# Patient Record
Sex: Male | Born: 1970 | Race: White | Hispanic: No | Marital: Married | Smoking: Never smoker
Health system: Southern US, Community
[De-identification: ages and names within clinical notes are randomized; demographics above are authoritative.]

## PROBLEM LIST (undated history)

## (undated) DIAGNOSIS — E785 Hyperlipidemia, unspecified: Secondary | ICD-10-CM

## (undated) DIAGNOSIS — K760 Fatty (change of) liver, not elsewhere classified: Secondary | ICD-10-CM

## (undated) DIAGNOSIS — C4491 Basal cell carcinoma of skin, unspecified: Secondary | ICD-10-CM

## (undated) DIAGNOSIS — J939 Pneumothorax, unspecified: Secondary | ICD-10-CM

## (undated) DIAGNOSIS — E119 Type 2 diabetes mellitus without complications: Secondary | ICD-10-CM

## (undated) HISTORY — DX: Pneumothorax, unspecified: J93.9

## (undated) HISTORY — DX: Fatty (change of) liver, not elsewhere classified: K76.0

## (undated) HISTORY — DX: Hereditary hemochromatosis: E83.110

## (undated) HISTORY — DX: Basal cell carcinoma of skin, unspecified: C44.91

## (undated) HISTORY — DX: Hyperlipidemia, unspecified: E78.5

---

## 2005-03-17 ENCOUNTER — Emergency Department: Payer: Self-pay | Admitting: Emergency Medicine

## 2011-11-18 ENCOUNTER — Ambulatory Visit (INDEPENDENT_AMBULATORY_CARE_PROVIDER_SITE_OTHER): Payer: PRIVATE HEALTH INSURANCE | Admitting: Family Medicine

## 2011-11-18 ENCOUNTER — Encounter: Payer: Self-pay | Admitting: Family Medicine

## 2011-11-18 VITALS — BP 120/76 | HR 88 | Temp 98.2°F | Ht 73.0 in | Wt 267.0 lb

## 2011-11-18 DIAGNOSIS — Z0000 Encounter for general adult medical examination without abnormal findings: Secondary | ICD-10-CM

## 2011-11-18 DIAGNOSIS — Z131 Encounter for screening for diabetes mellitus: Secondary | ICD-10-CM

## 2011-11-18 DIAGNOSIS — R5381 Other malaise: Secondary | ICD-10-CM

## 2011-11-18 DIAGNOSIS — Z13220 Encounter for screening for lipoid disorders: Secondary | ICD-10-CM

## 2011-11-18 NOTE — Patient Instructions (Signed)
F/u and schedule lab draw on your way out.

## 2011-11-18 NOTE — Progress Notes (Signed)
Nature conservation officer at [facility] [address] [place] [address] Phone: [phone] Fax: [phone]  Date:  11/18/2011   Name:  Ryan Calderon   DOB:  August 16, 1970   MRN:  [id number]  PCP:  Hannah Beat, MD    Chief Complaint: Establish Care   History of Present Illness:  Ryan Calderon is a 41 y.o. very pleasant male patient who presents with the following:  Has gained some weight. Works outside. 240 - then up to 267 now. 8-9 hours of sleep a night. Has changed what he is doing. Not really out in the field and all in the office.  Last 2-3 months. Gets check-ups at [facility].   6 months to a year.   Preventative Health Maintenance Visit:  Health Maintenance Summary Reviewed and updated, unless pt declines services.  Tobacco History Reviewed. Will Dip Alcohol: No concerns, no excessive use Exercise Habits: Some activity, rec at least 30 mins 5 times a week - rare now STD concerns: no risk or activity to increase risk Drug Use: None Encouraged self-testicular check  The PMH, PSH, Social History, Family History, Medications, and allergies have been reviewed in [facility], and have been updated if relevant.   History  Substance Use Topics  . Smoking status: Never Smoker   . Smokeless tobacco: Not on file  . Alcohol Use: Yes    No family history on file.  No Known Allergies  Medication list has been reviewed and updated.  No current outpatient prescriptions on file prior to visit.    Review of Systems:   General: Denies fever, chills, sweats. No significant weight loss. FATIGUE Eyes: Denies blurring,significant itching ENT: Denies earache, sore throat, and hoarseness. Cardiovascular: Denies chest pains, palpitations, dyspnea on exertion Respiratory: Denies cough, dyspnea at rest,wheeezing Breast: no concerns about lumps GI: Denies nausea, vomiting, diarrhea, constipation, change in bowel habits, abdominal pain, melena, hematochezia GU: Denies penile  discharge, ED, urinary flow / outflow problems. No STD concerns. DECREASED LIBIDO Musculoskeletal: Denies back pain, joint pain Derm: Denies rash, itching Neuro: Denies  paresthesias, frequent falls, frequent headaches Psych: Denies depression, anxiety Endocrine: Denies cold intolerance, heat intolerance, polydipsia Heme: Denies enlarged lymph nodes Allergy: No hayfever   Physical Examination: Filed Vitals:   11/18/11 1348  BP: 120/76  Pulse: 88  Temp: 98.2 F (36.8 C)   Filed Vitals:   11/18/11 1348  Height: 6\' 1"  (1.854 m)  Weight: 267 lb (121.11 kg)   Body mass index is 35.23 kg/(m^2). Ideal Body Weight: Weight in (lb) to have BMI = 25: 189.1   GEN: well developed, well nourished, no acute distress Eyes: conjunctiva and lids normal, PERRLA, EOMI ENT: TM clear, nares clear, oral exam WNL Neck: supple, no lymphadenopathy, no thyromegaly, no JVD Pulm: clear to auscultation and percussion, respiratory effort normal CV: regular rate and rhythm, S1-S2, no murmur, rub or gallop, no bruits, peripheral pulses normal and symmetric, no cyanosis, clubbing, edema or varicosities GI: soft, non-tender; no hepatosplenomegaly, masses; active bowel sounds all quadrants Lymph: no cervical, axillary or inguinal adenopathy MSK: gait normal, muscle tone and strength WNL, no joint swelling, effusions, discoloration, crepitus  SKIN: clear, good turgor, color WNL, no rashes, lesions, or ulcerations Neuro: normal mental status, normal strength, sensation, and motion Psych: alert; oriented to person, place and time, normally interactive and not anxious or depressed in appearance.   Assessment and Plan: 1. Routine general medical examination at a health care facility    2. Screening for lipoid disorders  3. Screening for diabetes mellitus    4. Other malaise and fatigue  Basic metabolic panel, CBC with Differential, Hepatic function panel, TSH, Testosterone, free, total    Generally doing  well. We will obtain his labs that he is gotten previously from his wife's work.  He has a lot of concerns about his decreased libido, fatigue, and generally not feeling well rested. He is also gained about 20-30 pounds recently. We will check labs as above and be sure to do this in the morning to obtain an a.m. testosterone.  Hannah Beat, MD

## 2011-11-22 ENCOUNTER — Other Ambulatory Visit: Payer: PRIVATE HEALTH INSURANCE

## 2013-01-21 ENCOUNTER — Ambulatory Visit: Payer: Self-pay

## 2014-10-30 IMAGING — CR DG FOOT COMPLETE 3+V*L*
1 series · 3 of 3 positions shown · non-contrast
Comparison: none

REASON FOR EXAM: crush injury
COMMENTS:

[Series 1: ap · 0.17mm/px · 3 of 3 slices shown]
[im 1/3]
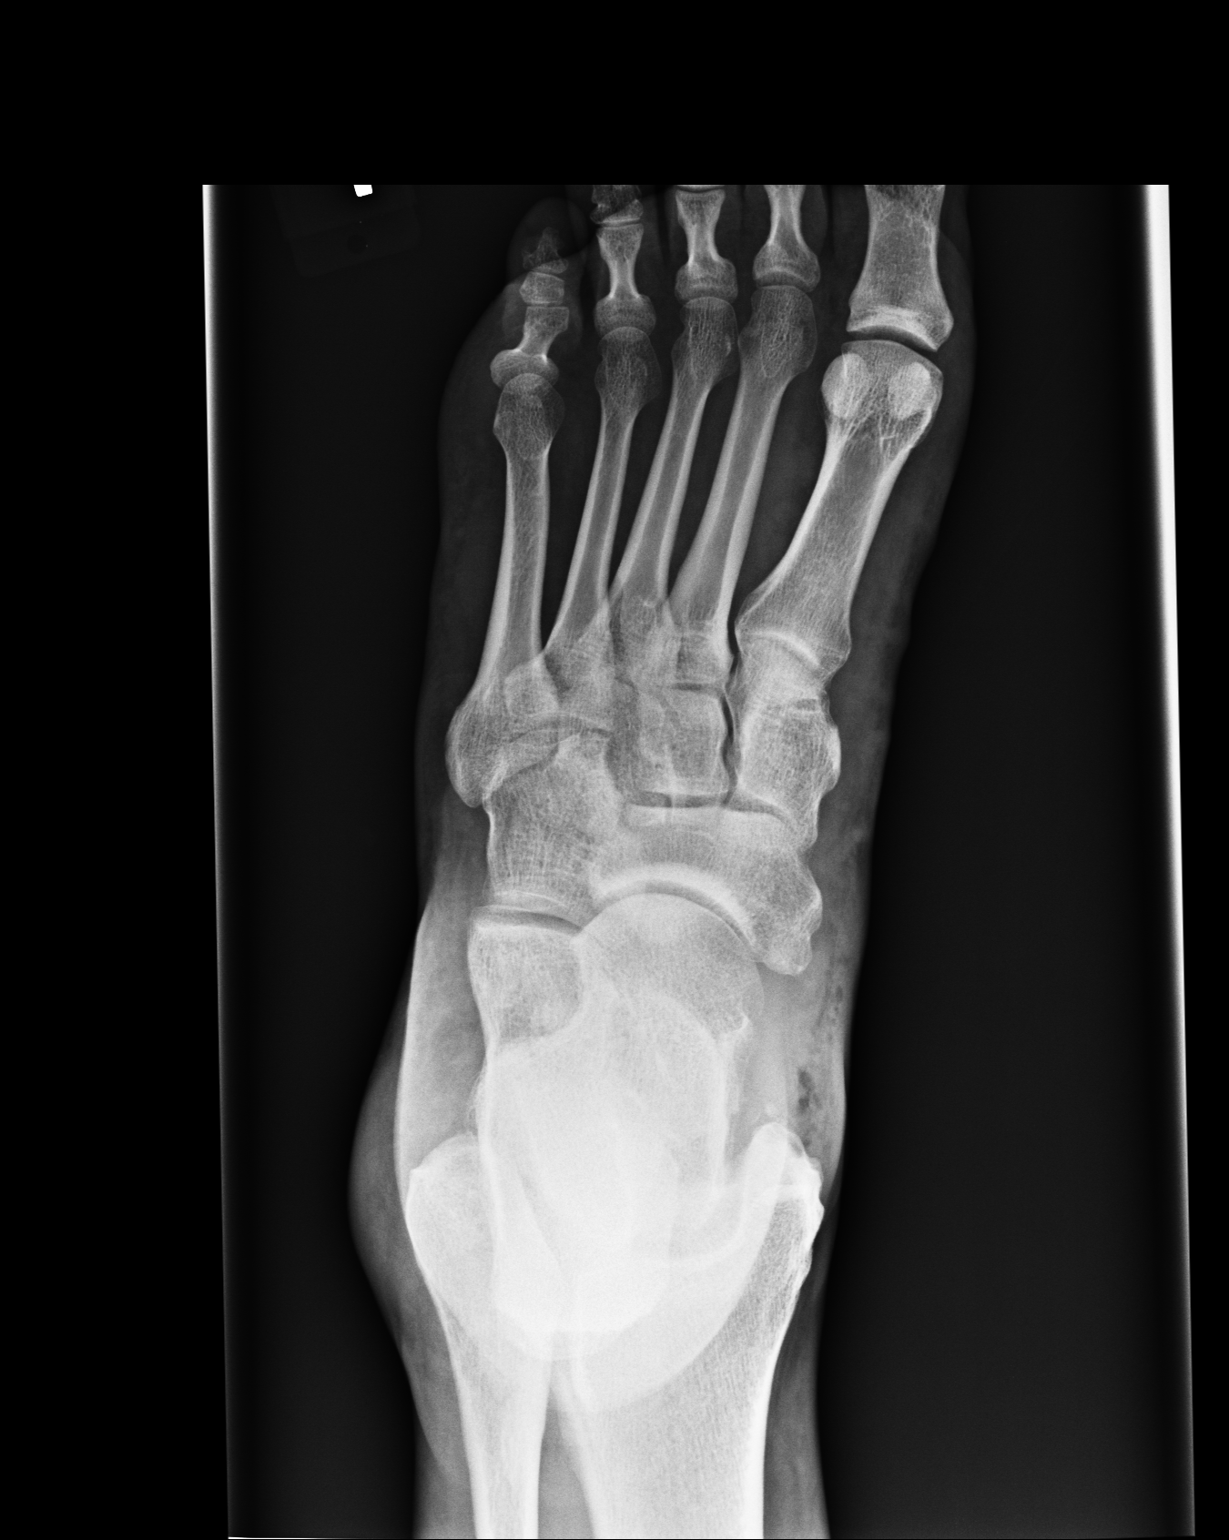
[im 2/3]
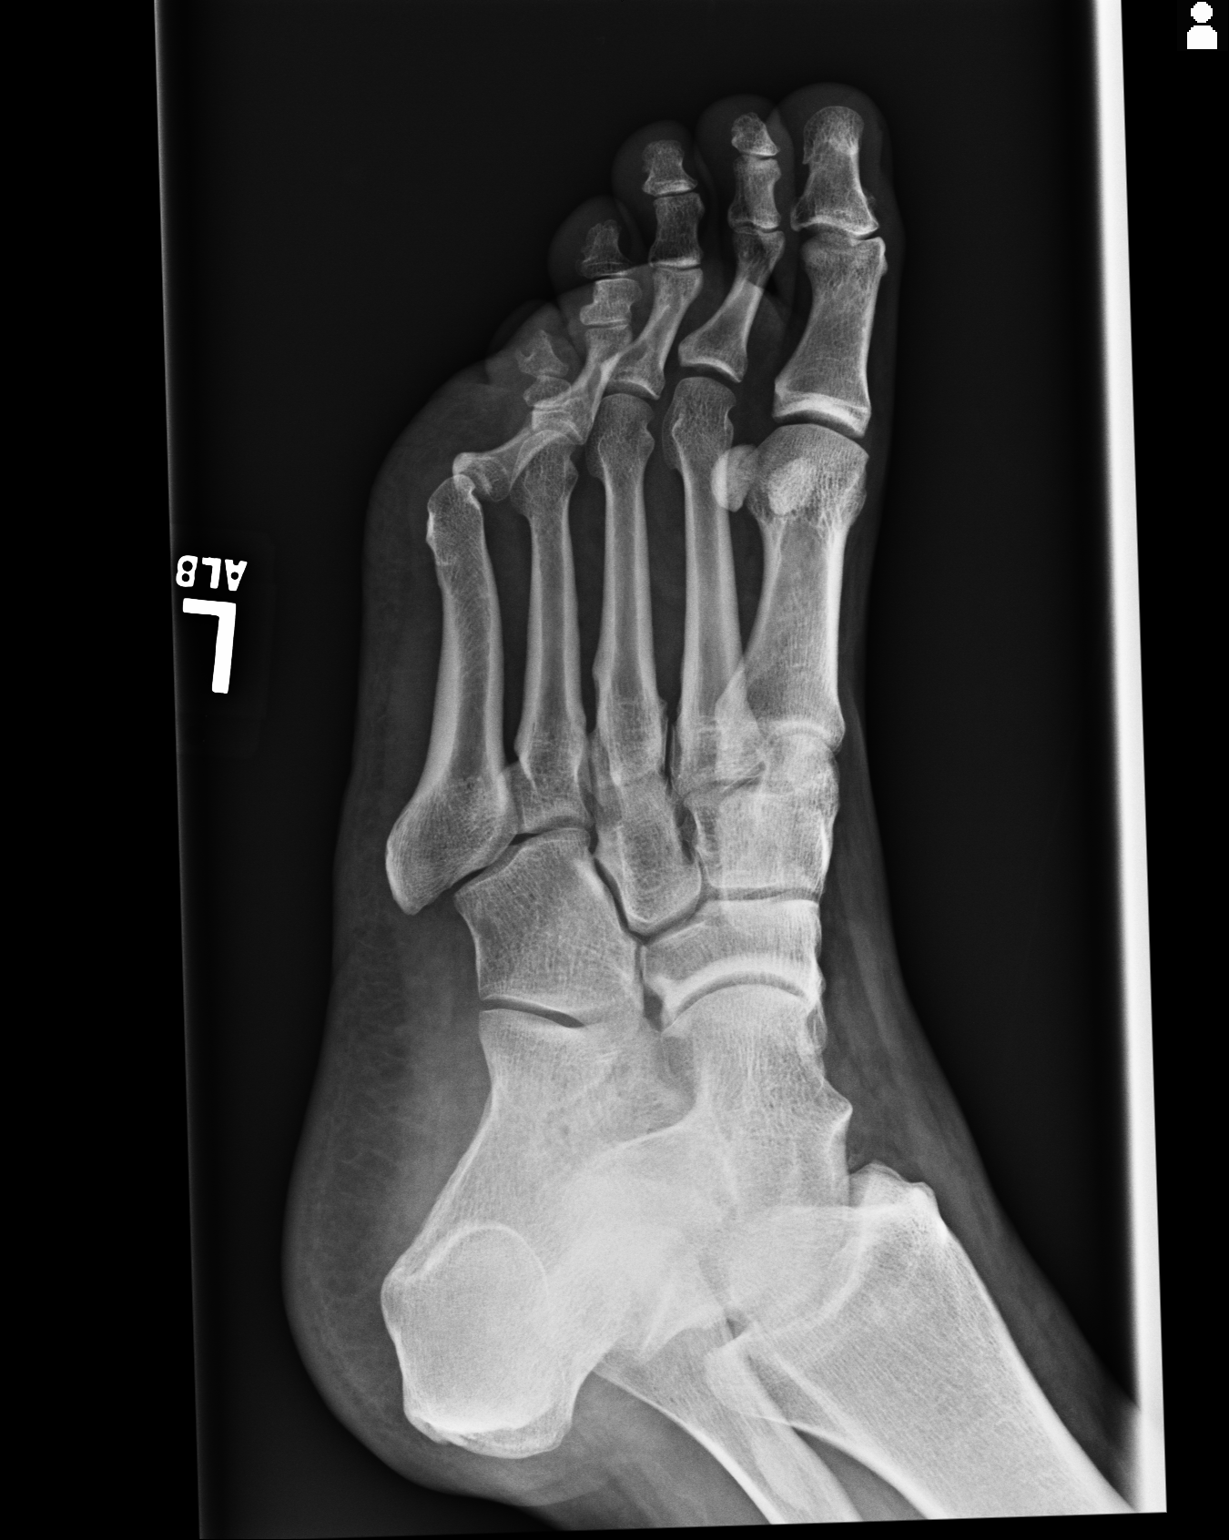
[im 3/3]
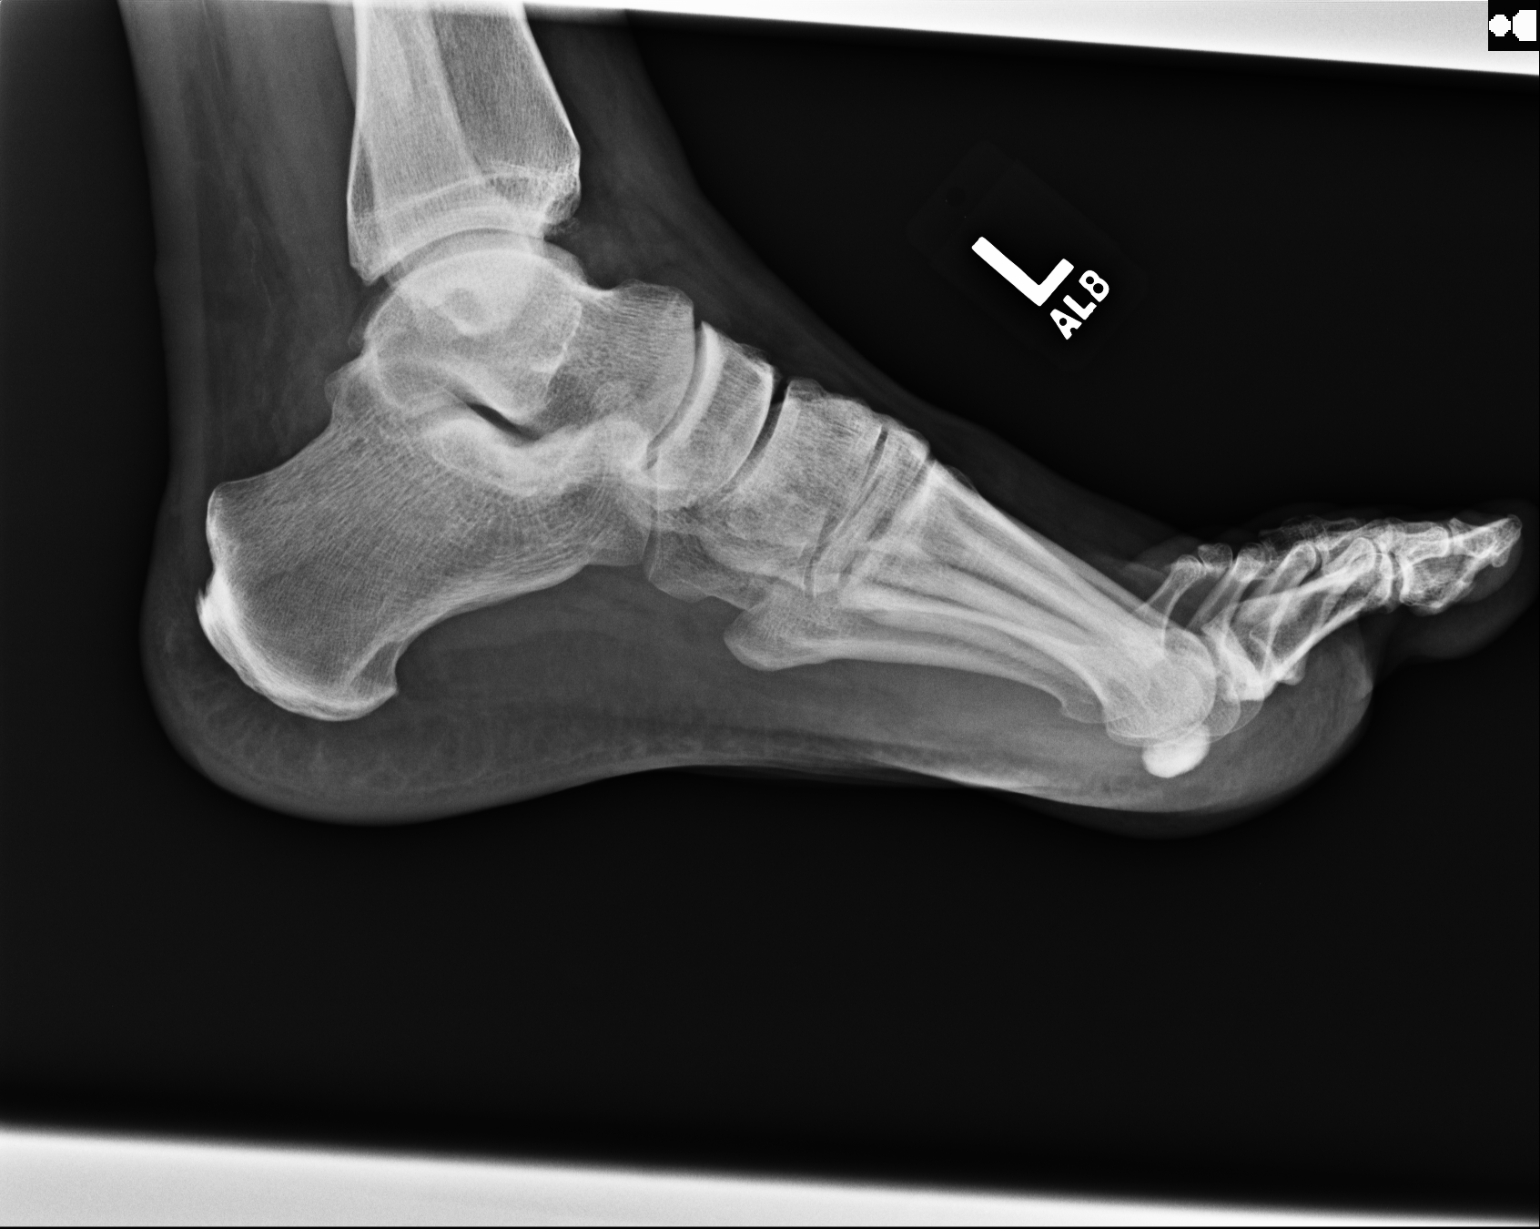

[3 of 3 positions shown; findings below may reference images not displayed]

PROCEDURE:     MDR - MDR FOOT LT COMP W/OBLQUES  - January 21, 2013 [DATE]

RESULT:     Three views of the left foot reveal the bones to be adequately
mineralized. There is soft tissue gas over the medial malleolar region there
is no evidence of an acute phalangeal or metatarsal fracture. The tarsal
bones appear intact as well.
IMPRESSION: There is soft tissue injury over the medial malleolar
region. I do not see evidence of an underlying fracture.

## 2014-10-30 IMAGING — CR DG ANKLE COMPLETE 3+V*L*
1 series · 5 of 5 positions shown · non-contrast
Comparison: none

REASON FOR EXAM: crush injury
COMMENTS:

[Series 1: ap · 0.17mm/px · 5 of 5 slices shown]
[im 1/5]
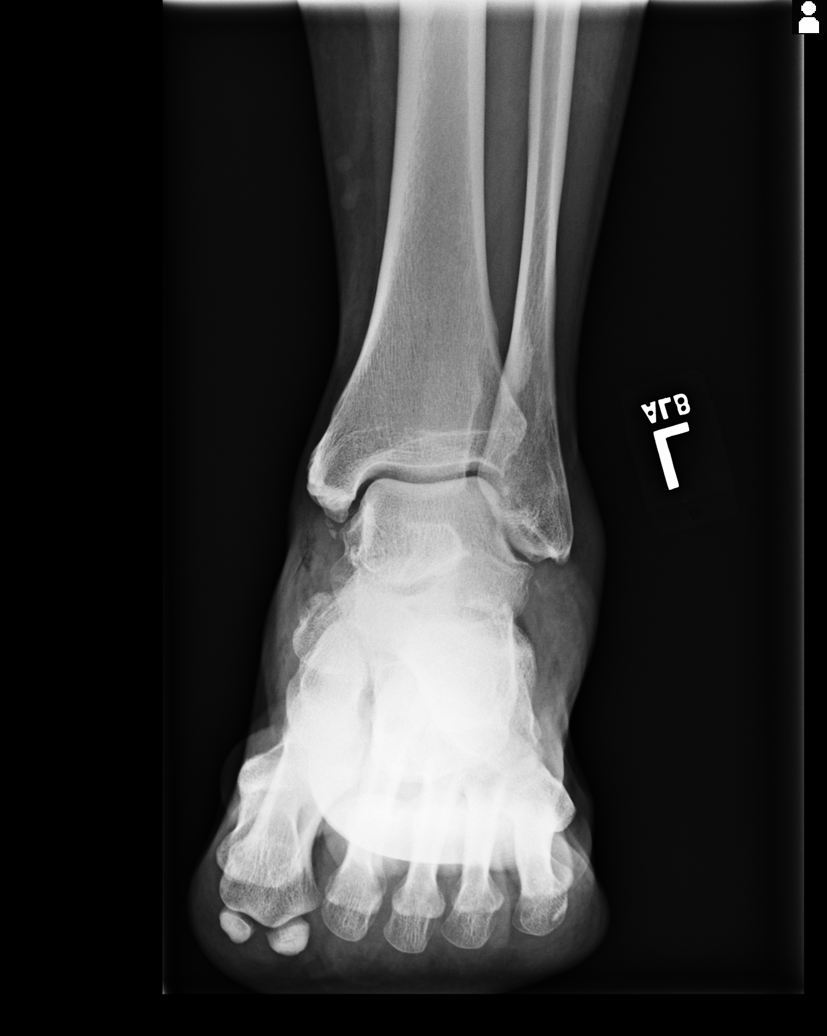
[im 2/5]
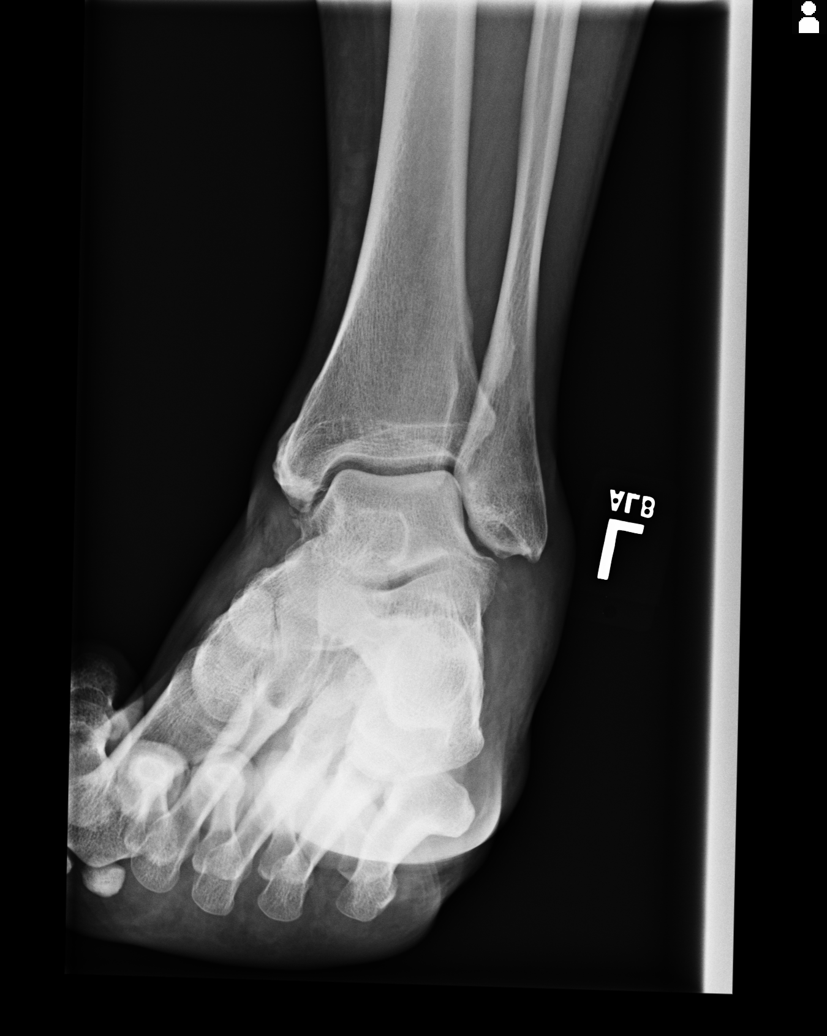
[im 3/5]
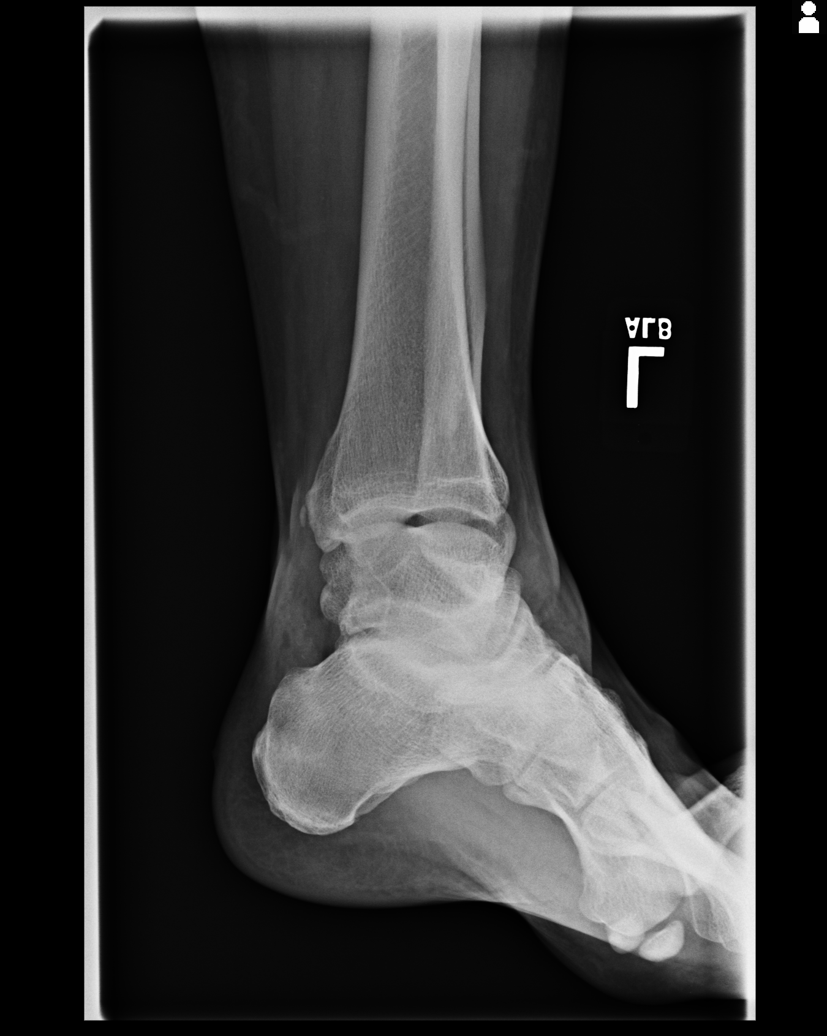
[im 4/5]
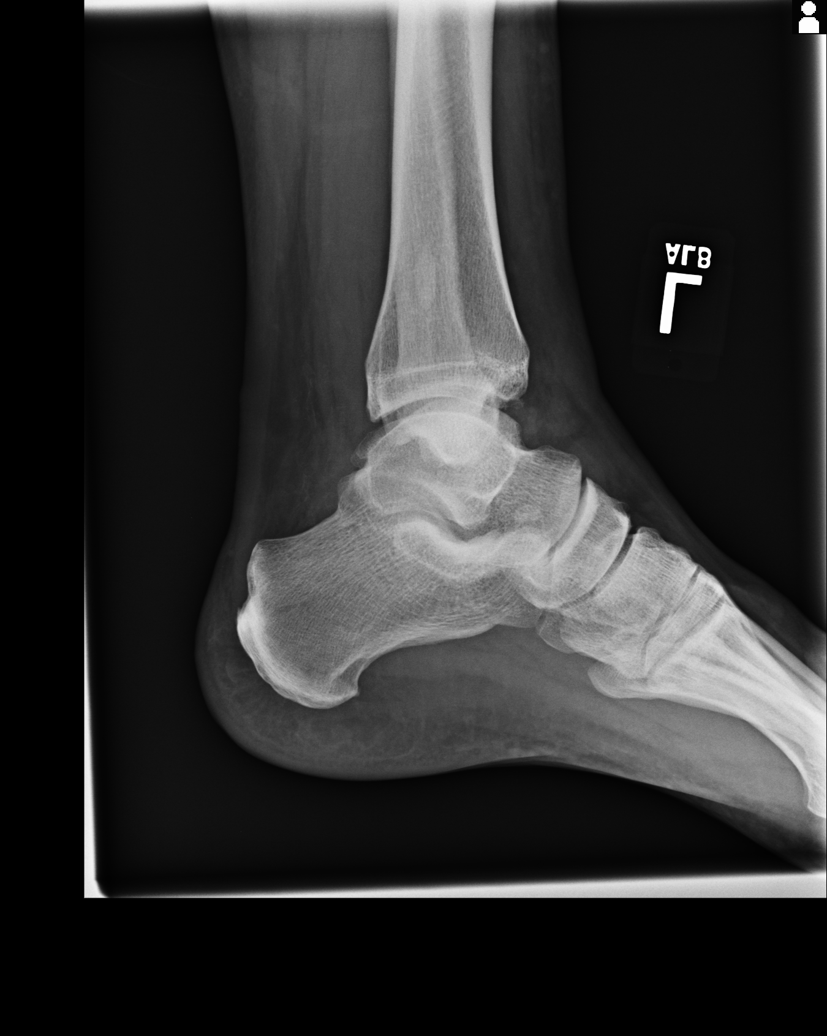
[im 5/5]
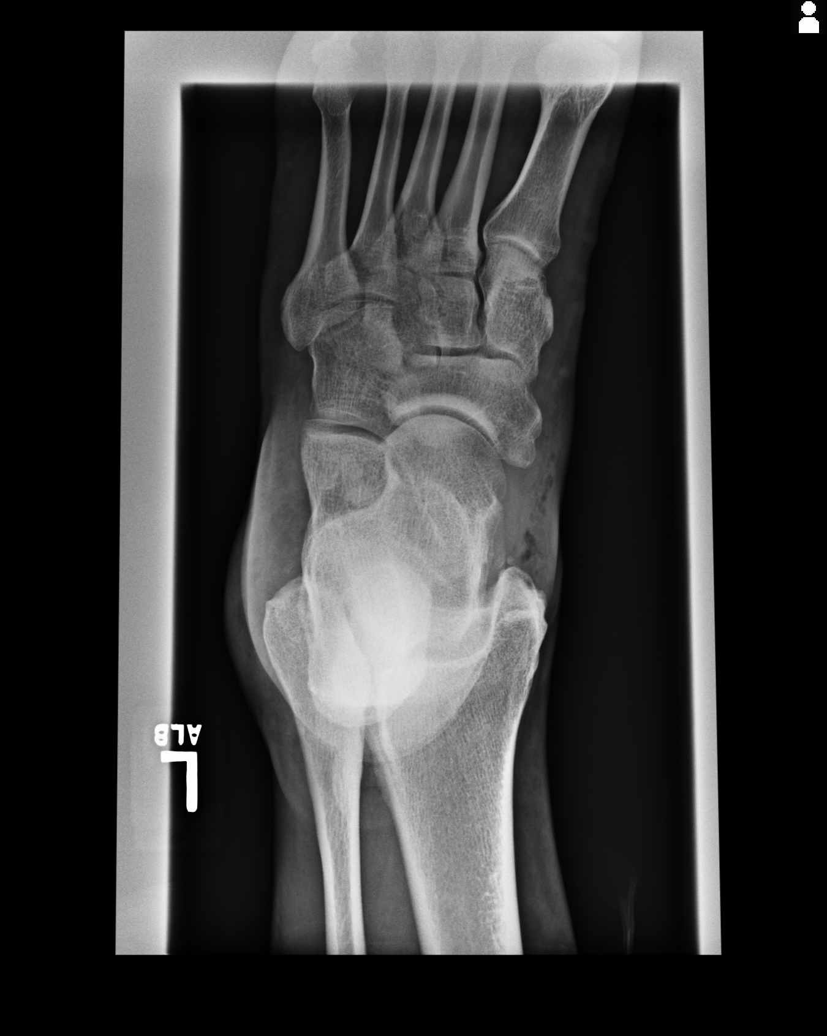

[5 of 5 positions shown; findings below may reference images not displayed]

PROCEDURE:     MDR - MDR ANKLE LEFT COMPLETE  - January 21, 2013 [DATE]

RESULT:     Five views of the left ankle reveal the joint mortise to be
preserved. The talar dome is intact. There is disruption of the soft tissues
over the medial malleolus. No definite acute malleolar fracture is
demonstrated. There are tiny bony densities inferior to the medial malleolus
which likely are degenerative in nature. There is no evidence of a talar or
calcaneal fracture. The metatarsal bases appear intact.
IMPRESSION: There is no evidence of an acute fracture of the bones of
the foot. There is soft tissue swelling diffusely and some disruption of the
soft tissues over the medial malleolus is present.

[REDACTED]

## 2015-03-14 ENCOUNTER — Inpatient Hospital Stay: Payer: BLUE CROSS/BLUE SHIELD

## 2015-03-14 ENCOUNTER — Inpatient Hospital Stay: Payer: BLUE CROSS/BLUE SHIELD | Attending: Internal Medicine | Admitting: Internal Medicine

## 2015-03-14 ENCOUNTER — Encounter: Payer: Self-pay | Admitting: *Deleted

## 2015-03-14 DIAGNOSIS — E785 Hyperlipidemia, unspecified: Secondary | ICD-10-CM | POA: Diagnosis not present

## 2015-03-14 NOTE — Progress Notes (Signed)
Patient here for new evaluation for hereditary hemochromatosis work up.  He is married and 2 children. His wife accompanies him with this visit. His wife works at [employer] and he gets his labs collected at [employer] with the [facility].  The patient is not taking any medications or over the counter medications. He drink an occasional beer on the weekend. He has never smoked; however he has used snuff for almost 20 years. He complains of generalized fatigue and joint pain and stiffness. He denies any shortness of breath or headaches. He donated blood to the [employer] on a regular basis; but has not donated any blood in almost 10 years.  RN present with MD when discussing patient's care plan.  Approximately 10 minutes spent by RN educating the patient about  hereditary hemochromatosis and the role of performing a therapeutic phlebotomy.

## 2015-03-14 NOTE — Progress Notes (Signed)
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  male with no significant past medical history noted to have elevated iron levels/ferritin levels on routine blood work. Patient had a positive hereditary hemochromatosis gene/listed above.   Patient denies any signs and symptoms of cirrhosis; denies any swelling in the legs or abdominal distention. Patient has 2 drinks over the weekend. He does not take any iron containing multivitamins. Denies any skin rash or unusual arthritic symptoms.  ROS: A complete 10 point review of system is done which is negative except mentioned above in history of present illness  MEDICAL HISTORY:  Past Medical History  Diagnosis Date  . Pneumothorax     Complicated course, Dr. Delilah Shan followed  . Hyperlipidemia   . Hereditary hemochromatosis     SURGICAL HISTORY: No past surgical history on file.  SOCIAL HISTORY: Social History   Social History  . Marital Status: Married    Spouse Name: N/A  . Number of Children: N/A  . Years of Education: N/A   Occupational History  . Not on file.   Social History Main Topics  . Smoking status: Never Smoker   . Smokeless tobacco: Current User    Types: Snuff     Comment: 37yrs with snuff  . Alcohol Use: 0.0 oz/week    0 Standard drinks or equivalent per week     Comment: ocassional beer  . Drug Use: No  . Sexual Activity:    Partners: Female   Other Topics Concern  . Not on file   Social History Narrative    FAMILY HISTORY: Family History  Problem Relation Age of Onset  . Prostate cancer Father 86  . Diabetes    . Liver disease Brother     ALLERGIES:  has No Known Allergies.  MEDICATIONS:  No current  outpatient prescriptions on file.   No current facility-administered medications for this visit.      Marland Kitchen  PHYSICAL EXAMINATION: ECOG PERFORMANCE STATUS: 0 - Asymptomatic  Filed Vitals:   03/14/15 1339  BP: 140/78  Pulse: 90  Temp: 97.8 F (36.6 C)  Resp: 18   There were no vitals filed for this visit.  GENERAL: Well-nourished well-developed; Alert, no distress and comfortable.  Accompanied by wife. EYES: no pallor or icterus OROPHARYNX: no thrush or ulceration; good dentition  NECK: supple, no masses felt LYMPH:  no palpable lymphadenopathy in the cervical, axillary or inguinal regions LUNGS: clear to auscultation and  No wheeze or crackles HEART/CVS: regular rate & rhythm and no murmurs; No lower extremity edema ABDOMEN: abdomen soft, non-tender and normal bowel sounds Musculoskeletal:no cyanosis of digits and no clubbing  PSYCH: alert & oriented x 3 with fluent speech NEURO: no focal motor/sensory deficits SKIN:  no rashes or significant lesions  LABORATORY DATA:  I have reviewed the data as listed No results found for: WBC, HGB, HCT, MCV, PLT No results for input(s): NA, K, CL, CO2, GLUCOSE, BUN, CREATININE, CALCIUM, GFRNONAA, GFRAA, PROT, ALBUMIN, AST, ALT, ALKPHOS, BILITOT, BILIDIR, IBILI in the last 8760 hours.  RADIOGRAPHIC STUDIES: I have personally reviewed the radiological images as listed and agreed with the findings in the report. No results found.  ASSESSMENT & PLAN:   # Hereditary hemochromatosis-  H63D-heterozygous- no obvious concerns of iron overload. Patient's LFT/AST was slightly elevated. Ferritin 600-700. I would recommend phlebotomy every 3 months or so to keep the ferritin less than 200.  I had a long discussion the patient regarding the patho-biology of hemochromatosis- the fact that elevated iron levels over many years could lead to deposition in the organs leading to-cirrhosis, arthralgias, skin pigmentation, diabetes; central endocrine  abnormalities; heart failure etc. However, I'm not overtly concerned about development of clinical hemochromatosis/iron overload in the patient.  # I counseled the patient regarding alcohol abstinence; avoiding multivitamin with iron.   # We also had a discussion regarding screening of family members; patient has 2 boys aged 35 and 83. Given the heterozygous gene positive for hemochromatosis gene- I would recommend holding off any testing at this time. His wife could be screened.   All questions were answered. The patient knows to call the clinic with any problems, questions or concerns.  .Thank you Dr.Copland for allowing me to participate in the care of your pleasant patient. Please do not hesitate to contact me with questions or concerns in the interim.  I spent 45 minutes counseling the patient face to face. The total time spent in the appointment was 60 minutes and more than 50% was on counseling.     Cammie Sickle, MD 03/14/2015 1:55 PM

## 2015-03-14 NOTE — Patient Instructions (Addendum)
Hemochromatosis Hemochromatosis, also called iron storage disease, is a condition in which the body stores too much iron. The excess iron builds up in your joints, heart, liver, pancreas, and other organs and damages them. CAUSES  Hemochromatosis may be caused by:  Abnormal genes. These are passed down (inherited) from both of your parents.  Having blood transfusions.  Having too much iron in your diet. RISK FACTORS  Being Caucasian.  Inheriting abnormal genes from both your parents.  Having a severe or long-term loss of red blood cells (anemia). SIGNS AND SYMPTOMS  Signs and symptoms can start at any age, but usually start in middle age. They may include:  Fatigue.  Weakness.  Joint pain.  Abdominal pain.  Weight loss.  Pearline Cables or bronze skin coloring.  Loss of interest in sex.  Loss of menstrual periods in women.  Loss of body hair.  Shortness of breath. Late signs of hemochromatosis include damage to the liver, heart, or pancreas. This may lead to:  Liver cancer.  Abnormal heart rhythms.  Heart failure.  Diabetes. DIAGNOSIS  Your health care provider will perform a physical exam and ask about your symptoms and family history. Tests may be done to confirm the diagnosis. Blood tests may include:  Transferrin saturation. This test measures how much iron is bound to hemoglobin in your blood. Hemoglobin is a substance in red blood cells that carries oxygen to the tissues of the body.  Serum ferritin. This test measures a protein that stores iron in your blood.  A test to check for the abnormal genes that cause the condition. You may have a tissue sample taken from your liver with a needle (biopsy) for testing. The results will show if iron is building up in your liver. TREATMENT  Hemochromatosis is treated by removing iron by taking blood (phlebotomy). Having a phlebotomy is similar to having blood taken for donation. The procedure is simple and effective as long  as it is started before organ damage develops. When you begin treatment, you may have a pint of blood removed once or twice a week. You may have blood tests done to determine when your iron levels return to normal. Once your iron levels are normal, you may only need to have a phlebotomy every few months. HOME CARE INSTRUCTIONS   Do not eat a lot of foods that are high in iron. Iron-rich foods include red meats and organ meats.  Do not take dietary supplements that contain iron.  Do not take vitamin C supplements. Vitamin C increases iron absorption.  Do not eat raw shellfish or raw fish. Hemochromatosis may increase your chance of infection from these foods.  If you have liver damage, do not drink any alcohol.  If you do not have liver damage, limit alcohol intake to no more than 1 drink per day for nonpregnant women and 2 drinks per day for men. One drink equals 12 ounces of beer, 5 ounces of wine, or 1 ounces of hard liquor.  Try to exercise for at least 30 minutes on most days of the week.  Keep all follow-up visits as directed by your health care provider. This is important. SEEK MEDICAL CARE IF:  You have fatigue.  You have weakness.  You have joint pain.  You have abdominal pain.  You experience weight loss.  You have shortness of breath. SEEK IMMEDIATE MEDICAL CARE IF:  You have chest pain.  You have trouble breathing.   This information is not intended to replace advice  given to you by your health care provider. Make sure you discuss any questions you have with your health care provider.   Document Released: 04/19/2000 Document Revised: 05/13/2014 Document Reviewed: 06/16/2013 Elsevier Interactive Patient Education 2016 [place].     Therapeutic Phlebotomy Therapeutic phlebotomy is the controlled removal of blood from a person's body for the purpose of treating a medical condition. The procedure is similar to donating blood. Usually, about a pint (470 mL,  or 0.47L) of blood is removed. The average adult has 9-12 pints (4.3-5.7 L) of blood. Therapeutic phlebotomy may be used to treat the following medical conditions:  Hemochromatosis. This is a condition in which the blood contains too much iron.  Polycythemia vera. This is a condition in which the blood contains too many red blood cells.  Porphyria cutanea tarda. This is a disease in which an important part of hemoglobin is not made properly. It results in the buildup of abnormal amounts of porphyrins in the body.  Sickle cell disease. This is a condition in which the red blood cells form an abnormal crescent shape rather than a round shape. LET [facility] CARE PROVIDER KNOW ABOUT:  Any allergies you have.  All medicines you are taking, including vitamins, herbs, eye drops, creams, and over-the-counter medicines.  Previous problems you or members of your family have had with the use of anesthetics.  Any blood disorders you have.  Previous surgeries you have had.  Any medical conditions you may have. RISKS AND COMPLICATIONS Generally, this is a safe procedure. However, problems may occur, including:  Nausea or light-headedness.  Low blood pressure.  Soreness, bleeding, swelling, or bruising at the needle insertion site.  Infection. BEFORE THE PROCEDURE  Follow instructions from your health care provider about eating or drinking restrictions.  Ask your health care provider about changing or stopping your regular medicines. This is especially important if you are taking diabetes medicines or blood thinners.  Wear clothing with sleeves that can be raised above the elbow.  Plan to have someone take you home after the procedure.  You may have a blood sample taken. PROCEDURE  A needle will be inserted into one of your veins.  Tubing and a collection bag will be attached to that needle.  Blood will flow through the needle and tubing into the collection bag.  You may be  asked to open and close your hand slowly and continually during the entire collection.  After the specified amount of blood has been removed from your body, the collection bag and tubing will be clamped.  The needle will be removed from your vein.  Pressure will be held on the site of the needle insertion to stop the bleeding.  A bandage (dressing) will be placed over the needle insertion site. The procedure may vary among health care providers and hospitals. AFTER THE PROCEDURE  Your recovery will be assessed and monitored.  You can return to your normal activities as directed by your health care provider.   This information is not intended to replace advice given to you by your health care provider. Make sure you discuss any questions you have with your health care provider.   Document Released: 09/24/2010 Document Revised: 09/06/2014 Document Reviewed: 04/18/2014 Elsevier Interactive Patient Education 2016 [place].  Therapeutic Phlebotomy, Care After Refer to this sheet in the next few weeks. These instructions provide you with information about caring for yourself after your procedure. Your health care provider may also give you more specific instructions.  Your treatment has been planned according to current medical practices, but problems sometimes occur. Call your health care provider if you have any problems or questions after your procedure. WHAT TO EXPECT AFTER THE PROCEDURE After your procedure, it is common to have:  Light-headedness or dizziness. You may feel faint.  Nausea.  Tiredness. HOME CARE INSTRUCTIONS Activities  Return to your normal activities as directed by your health care provider. Most people can go back to their normal activities right away.  Avoid strenuous physical activity and heavy lifting or pulling for about 5 hours after the procedure. Do not lift anything that is heavier than 10 lb (4.5 kg).  Athletes should avoid strenuous exercise for  at least 12 hours.  Change positions slowly for the remainder of the day. This will help to prevent light-headedness or fainting.  If you feel light-headed, lie down until the feeling goes away. Eating and Drinking  Be sure to eat well-balanced meals for the next 24 hours.  Drink enough fluid to keep your urine clear or pale yellow.  Avoid drinking alcohol on the day that you had the procedure. Care of the Needle Insertion Site  Keep your bandage dry. You can remove the bandage after about 5 hours or as directed by your health care provider.  If you have bleeding from the needle insertion site, elevate your arm and press firmly on the site until the bleeding stops.  If you have bruising at the site, apply ice to the area:  Put ice in a plastic bag.  Place a towel between your skin and the bag.  Leave the ice on for 20 minutes, 2-3 times a day for the first 24 hours.  If the swelling does not go away after 24 hours, apply a warm, moist washcloth to the area for 20 minutes, 2-3 times a day. General Instructions  Avoid smoking for at least 30 minutes after the procedure.  Keep all follow-up visits as directed by your health care provider. It is important to continue with further therapeutic phlebotomy treatments as directed. SEEK MEDICAL CARE IF:  You have redness, swelling, or pain at the needle insertion site.  You have fluid, blood, or pus coming from the needle insertion site.  You feel light-headed, dizzy, or nauseated, and the feeling does not go away.  You notice new bruising at the needle insertion site.  You feel weaker than normal.  You have a fever or chills. SEEK IMMEDIATE MEDICAL CARE IF:  You have severe nausea or vomiting.  You have chest pain.  You have trouble breathing.   This information is not intended to replace advice given to you by your health care provider. Make sure you discuss any questions you have with your health care provider.    Document Released: 09/24/2010 Document Revised: 09/06/2014 Document Reviewed: 04/18/2014 Elsevier Interactive Patient Education 2016 [employer]  Therapeutic Phlebotomy Discharge Instructions  - Increase your fluid intake over the next 4 hours  - No smoking for 30 minutes  - Avoid using the affected arm (the one you had the blood drawn from) for heavy lifting or other activities.  - You may resume all normal activities after 30 minutes.  You are to notify the office if you experience:   - Persistent dizziness and/or lightheadedness -Uncontrolled or excessive bleeding at the site.

## 2015-06-12 ENCOUNTER — Telehealth: Payer: Self-pay | Admitting: *Deleted

## 2015-06-12 ENCOUNTER — Inpatient Hospital Stay: Payer: BLUE CROSS/BLUE SHIELD | Attending: Internal Medicine

## 2015-06-12 LAB — HEMOGLOBIN: Hemoglobin: 14.9 g/dL (ref 13.0–18.0)

## 2015-06-12 LAB — HEMATOCRIT: HCT: 43.6 % (ref 40.0–52.0)

## 2015-06-12 NOTE — Telephone Encounter (Signed)
Ferritin not collected today per md order. MD needs pt to see the ferritin level before phlebotomy tomorrow.  I called the patient and left a vm on the cell/home phone asking the patient to come to clinic tomorrow for lab draw only. We can contact the patient after we get the ferritin level back to determine whether he actually needs the phlebotomy.

## 2015-06-13 ENCOUNTER — Inpatient Hospital Stay: Payer: BLUE CROSS/BLUE SHIELD

## 2015-06-13 LAB — FERRITIN: FERRITIN: 342 ng/mL — AB (ref 24–336)

## 2015-06-13 NOTE — Telephone Encounter (Signed)
Pt came to clinic today. Face to face discussion with patient. We will check his labs today and will contact him regarding whether his phlebotomy is needed.

## 2015-06-14 ENCOUNTER — Other Ambulatory Visit: Payer: Self-pay | Admitting: Internal Medicine

## 2015-06-14 NOTE — Telephone Encounter (Signed)
Msg sent to [facility] scheduling to arrange phlebotomy in [place] and to contact pt with apt.

## 2015-06-16 ENCOUNTER — Inpatient Hospital Stay: Payer: BLUE CROSS/BLUE SHIELD

## 2015-09-11 ENCOUNTER — Other Ambulatory Visit: Payer: BLUE CROSS/BLUE SHIELD

## 2015-09-12 ENCOUNTER — Ambulatory Visit: Payer: BLUE CROSS/BLUE SHIELD | Admitting: Internal Medicine

## 2015-09-18 ENCOUNTER — Inpatient Hospital Stay: Payer: BLUE CROSS/BLUE SHIELD | Attending: Internal Medicine

## 2015-09-18 ENCOUNTER — Other Ambulatory Visit: Payer: BLUE CROSS/BLUE SHIELD

## 2015-09-18 DIAGNOSIS — E785 Hyperlipidemia, unspecified: Secondary | ICD-10-CM | POA: Insufficient documentation

## 2015-09-18 DIAGNOSIS — Z8042 Family history of malignant neoplasm of prostate: Secondary | ICD-10-CM | POA: Diagnosis not present

## 2015-09-18 DIAGNOSIS — G4730 Sleep apnea, unspecified: Secondary | ICD-10-CM | POA: Insufficient documentation

## 2015-09-18 DIAGNOSIS — R5383 Other fatigue: Secondary | ICD-10-CM | POA: Diagnosis not present

## 2015-09-18 LAB — HEPATIC FUNCTION PANEL
ALBUMIN: 4.3 g/dL (ref 3.5–5.0)
ALK PHOS: 73 U/L (ref 38–126)
ALT: 53 U/L (ref 17–63)
AST: 32 U/L (ref 15–41)
BILIRUBIN DIRECT: 0.1 mg/dL (ref 0.1–0.5)
BILIRUBIN TOTAL: 0.9 mg/dL (ref 0.3–1.2)
Indirect Bilirubin: 0.8 mg/dL (ref 0.3–0.9)
Total Protein: 7.6 g/dL (ref 6.5–8.1)

## 2015-09-18 LAB — CBC WITH DIFFERENTIAL/PLATELET
BASOS ABS: 0.1 10*3/uL (ref 0–0.1)
BASOS PCT: 1 %
Eosinophils Absolute: 0.1 10*3/uL (ref 0–0.7)
Eosinophils Relative: 1 %
HEMATOCRIT: 45 % (ref 40.0–52.0)
HEMOGLOBIN: 15.8 g/dL (ref 13.0–18.0)
Lymphocytes Relative: 27 %
Lymphs Abs: 3.4 10*3/uL (ref 1.0–3.6)
MCH: 31.7 pg (ref 26.0–34.0)
MCHC: 35.1 g/dL (ref 32.0–36.0)
MCV: 90.3 fL (ref 80.0–100.0)
MONO ABS: 1.2 10*3/uL — AB (ref 0.2–1.0)
MONOS PCT: 10 %
NEUTROS ABS: 7.6 10*3/uL — AB (ref 1.4–6.5)
NEUTROS PCT: 61 %
Platelets: 286 10*3/uL (ref 150–440)
RBC: 4.98 MIL/uL (ref 4.40–5.90)
RDW: 12.8 % (ref 11.5–14.5)
WBC: 12.5 10*3/uL — ABNORMAL HIGH (ref 3.8–10.6)

## 2015-09-18 LAB — FERRITIN: Ferritin: 279 ng/mL (ref 24–336)

## 2015-09-19 ENCOUNTER — Inpatient Hospital Stay: Payer: BLUE CROSS/BLUE SHIELD

## 2015-09-19 ENCOUNTER — Inpatient Hospital Stay (HOSPITAL_BASED_OUTPATIENT_CLINIC_OR_DEPARTMENT_OTHER): Payer: BLUE CROSS/BLUE SHIELD | Admitting: Internal Medicine

## 2015-09-19 DIAGNOSIS — Z8042 Family history of malignant neoplasm of prostate: Secondary | ICD-10-CM | POA: Diagnosis not present

## 2015-09-19 DIAGNOSIS — E785 Hyperlipidemia, unspecified: Secondary | ICD-10-CM

## 2015-09-19 DIAGNOSIS — G4730 Sleep apnea, unspecified: Secondary | ICD-10-CM

## 2015-09-19 DIAGNOSIS — R5383 Other fatigue: Secondary | ICD-10-CM

## 2015-09-19 NOTE — Progress Notes (Signed)
[facility] NOTE  Patient Care Team: Owens Loffler, MD as PCP - General (Family Medicine)  CHIEF COMPLAINTS/PURPOSE OF CONSULTATION:   # OCT 2016 HEMOCHROMATOSIS CARRIER [H63D heterozygous; neg- c282y & s65c]; Ferritin 600-700; Iron sat 44%; START Phlebotomy q36m  HISTORY OF PRESENTING ILLNESS:  Ryan Calderon 45 y.o.  male with positive heterozygous hereditary hemochromatosis gene- is here for follow-up. Patient had phlebotomy approximately 3 months ago. He noted significant improvement of his fatigue/ overall well-being.  However again complains of fatigue being sluggish. Otherwise denies any symptoms of leg swelling or abdominal distention.  ROS: A complete 10 point review of system is done which is negative except mentioned above in history of present illness  MEDICAL HISTORY:  Past Medical History  Diagnosis Date  . Pneumothorax     Complicated course, Dr. Delilah Shan followed  . Hyperlipidemia   . Hereditary hemochromatosis     SURGICAL HISTORY: No past surgical history on file.  SOCIAL HISTORY: Social History   Social History  . Marital Status: Married    Spouse Name: N/A  . Number of Children: N/A  . Years of Education: N/A   Occupational History  . Not on file.   Social History Main Topics  . Smoking status: Never Smoker   . Smokeless tobacco: Current User    Types: Snuff     Comment: 85yrs with snuff  . Alcohol Use: 0.0 oz/week    0 Standard drinks or equivalent per week     Comment: ocassional beer  . Drug Use: No  . Sexual Activity:    Partners: Female   Other Topics Concern  . Not on file   Social History Narrative    FAMILY HISTORY: Family History  Problem Relation Age of Onset  . Prostate cancer Father 69  . Diabetes    . Liver disease Brother     ALLERGIES:  is allergic to peanut-containing drug products.  MEDICATIONS:  No current outpatient prescriptions on file.   No current facility-administered medications for  this visit.      Marland Kitchen  PHYSICAL EXAMINATION: ECOG PERFORMANCE STATUS: 0 - Asymptomatic  Filed Vitals:   09/19/15 1359  BP: 133/80  Pulse: 84  Temp: 98.9 F (37.2 C)  Resp: 18   Filed Weights   09/19/15 1359  Weight: 272 lb 4.3 oz (123.5 kg)    GENERAL: Well-nourished well-developed; Alert, no distress and comfortable.  Accompanied by wife. EYES: no pallor or icterus OROPHARYNX: no thrush or ulceration; good dentition  NECK: supple, no masses felt LYMPH:  no palpable lymphadenopathy in the cervical, axillary or inguinal regions LUNGS: clear to auscultation and  No wheeze or crackles HEART/CVS: regular rate & rhythm and no murmurs; No lower extremity edema ABDOMEN: abdomen soft, non-tender and normal bowel sounds Musculoskeletal:no cyanosis of digits and no clubbing  PSYCH: alert & oriented x 3 with fluent speech NEURO: no focal motor/sensory deficits SKIN:  no rashes or significant lesions  LABORATORY DATA:  I have reviewed the data as listed Lab Results  Component Value Date   WBC 12.5* 09/18/2015   HGB 15.8 09/18/2015   HCT 45.0 09/18/2015   MCV 90.3 09/18/2015   PLT 286 09/18/2015    Recent Labs  09/18/15 1356  PROT 7.6  ALBUMIN 4.3  AST 32  ALT 53  ALKPHOS 73  BILITOT 0.9  BILIDIR 0.1  IBILI 0.8     ASSESSMENT & PLAN:   # Hereditary hemochromatosis- H63D-heterozygous-Without any evidence of iron overload/organ  dysfunction. Since patient had significant improvement of his symptoms post phlebotomy I would recommend phlebotomy today. Discussed that would recommend ferritin levels less than 200.  # Fatigue- multifactorial question related to sleep apnea. Patient's body habitus high risk for sleep apnea. Recommend follow up with PCP.  # Patient will get follow-up H&H/ferritin/possible phlebotomy 3 months; follow-up with me H&H ferritin/possible phlebotomy in 6 months.     Cammie Sickle, MD 09/19/2015 2:18 PM

## 2015-09-26 ENCOUNTER — Ambulatory Visit: Payer: BLUE CROSS/BLUE SHIELD | Admitting: Internal Medicine

## 2015-12-19 ENCOUNTER — Inpatient Hospital Stay: Payer: BLUE CROSS/BLUE SHIELD | Attending: Internal Medicine

## 2015-12-19 LAB — HEMOGLOBIN: HEMOGLOBIN: 15.4 g/dL (ref 13.0–18.0)

## 2015-12-19 LAB — HEMATOCRIT: HEMATOCRIT: 44.9 % (ref 40.0–52.0)

## 2015-12-19 LAB — FERRITIN: FERRITIN: 200 ng/mL (ref 24–336)

## 2015-12-21 NOTE — Progress Notes (Signed)
Called patient to inform him he will need phlebotomy as planned.  Verbalized understanding.

## 2015-12-26 ENCOUNTER — Inpatient Hospital Stay: Payer: BLUE CROSS/BLUE SHIELD

## 2016-03-12 ENCOUNTER — Inpatient Hospital Stay: Payer: BLUE CROSS/BLUE SHIELD | Attending: Internal Medicine

## 2016-03-12 DIAGNOSIS — Z8042 Family history of malignant neoplasm of prostate: Secondary | ICD-10-CM | POA: Insufficient documentation

## 2016-03-12 DIAGNOSIS — E785 Hyperlipidemia, unspecified: Secondary | ICD-10-CM | POA: Insufficient documentation

## 2016-03-19 ENCOUNTER — Inpatient Hospital Stay (HOSPITAL_BASED_OUTPATIENT_CLINIC_OR_DEPARTMENT_OTHER): Payer: BLUE CROSS/BLUE SHIELD | Admitting: Internal Medicine

## 2016-03-19 ENCOUNTER — Other Ambulatory Visit: Payer: BLUE CROSS/BLUE SHIELD

## 2016-03-19 ENCOUNTER — Inpatient Hospital Stay: Payer: BLUE CROSS/BLUE SHIELD

## 2016-03-19 DIAGNOSIS — Z8042 Family history of malignant neoplasm of prostate: Secondary | ICD-10-CM

## 2016-03-19 DIAGNOSIS — R785 Finding of other psychotropic drug in blood: Secondary | ICD-10-CM | POA: Diagnosis not present

## 2016-03-19 DIAGNOSIS — E785 Hyperlipidemia, unspecified: Secondary | ICD-10-CM | POA: Diagnosis not present

## 2016-03-19 LAB — CBC WITH DIFFERENTIAL/PLATELET
Basophils Absolute: 0.1 10*3/uL (ref 0–0.1)
Basophils Relative: 1 %
EOS ABS: 0.1 10*3/uL (ref 0–0.7)
EOS PCT: 1 %
HCT: 47.4 % (ref 40.0–52.0)
Hemoglobin: 16 g/dL (ref 13.0–18.0)
LYMPHS ABS: 2.6 10*3/uL (ref 1.0–3.6)
Lymphocytes Relative: 32 %
MCH: 31 pg (ref 26.0–34.0)
MCHC: 33.8 g/dL (ref 32.0–36.0)
MCV: 91.7 fL (ref 80.0–100.0)
MONO ABS: 0.6 10*3/uL (ref 0.2–1.0)
MONOS PCT: 8 %
Neutro Abs: 4.7 10*3/uL (ref 1.4–6.5)
Neutrophils Relative %: 58 %
PLATELETS: 254 10*3/uL (ref 150–440)
RBC: 5.17 MIL/uL (ref 4.40–5.90)
RDW: 13.1 % (ref 11.5–14.5)
WBC: 8.1 10*3/uL (ref 3.8–10.6)

## 2016-03-19 LAB — FERRITIN: FERRITIN: 202 ng/mL (ref 24–336)

## 2016-03-19 LAB — HEPATIC FUNCTION PANEL
ALK PHOS: 66 U/L (ref 38–126)
ALT: 46 U/L (ref 17–63)
AST: 29 U/L (ref 15–41)
Albumin: 4.3 g/dL (ref 3.5–5.0)
BILIRUBIN INDIRECT: 0.7 mg/dL (ref 0.3–0.9)
Bilirubin, Direct: 0.2 mg/dL (ref 0.1–0.5)
TOTAL PROTEIN: 7.5 g/dL (ref 6.5–8.1)
Total Bilirubin: 0.9 mg/dL (ref 0.3–1.2)

## 2016-03-19 NOTE — Progress Notes (Signed)
Patient is here for follow up  

## 2016-03-19 NOTE — Assessment & Plan Note (Signed)
#   Hereditary hemochromatosis- H63D-heterozygous-Without any evidence of iron overload/organ dysfunction. Recommend phlebotomy to keep the ferritin less than 200. Ferritin today is pending  # Patient will get follow-up CBC/iron studies/ferritin-few days prior/possible phlebotomy 6 months.

## 2016-03-19 NOTE — Progress Notes (Signed)
[facility] NOTE  Patient Care Team: Owens Loffler, MD as PCP - General (Family Medicine)  CHIEF COMPLAINTS/PURPOSE OF CONSULTATION:   # OCT 2016 HEMOCHROMATOSIS CARRIER [H63D heterozygous; neg- c282y & s65c]; Ferritin 600-700; Iron sat 44%; on intermittent phlebotomy; Goal < 200.   HISTORY OF PRESENTING ILLNESS:  Ryan Calderon 45 y.o.  male with positive heterozygous hereditary hemochromatosis gene is here for follow-up. Patient had phlebotomy approximately August 2017 months ago.   Overall he feels okay. Otherwise denies any symptoms of leg swelling or abdominal distention.   ROS: A complete 10 point review of system is done which is negative except mentioned above in history of present illness  MEDICAL HISTORY:  Past Medical History:  Diagnosis Date  . Hereditary hemochromatosis   . Hyperlipidemia   . Pneumothorax    Complicated course, Dr. Delilah Shan followed    SURGICAL HISTORY: No past surgical history on file.  SOCIAL HISTORY: Social History   Social History  . Marital status: Married    Spouse name: N/A  . Number of children: N/A  . Years of education: N/A   Occupational History  . Not on file.   Social History Main Topics  . Smoking status: Never Smoker  . Smokeless tobacco: Current User    Types: Snuff     Comment: 84yrs with snuff  . Alcohol use 0.0 oz/week     Comment: ocassional beer  . Drug use: No  . Sexual activity: Yes    Partners: Female   Other Topics Concern  . Not on file   Social History Narrative  . No narrative on file    FAMILY HISTORY: Family History  Problem Relation Age of Onset  . Prostate cancer Father 53  . Diabetes    . Liver disease Brother     ALLERGIES:  is allergic to peanut-containing drug products.  MEDICATIONS:  No current outpatient prescriptions on file.   No current facility-administered medications for this visit.       Marland Kitchen  PHYSICAL EXAMINATION: ECOG PERFORMANCE STATUS: 0 -  Asymptomatic  Vitals:   03/19/16 0859  BP: 123/88  Pulse: 76  Resp: 18  Temp: 97.8 F (36.6 C)   Filed Weights   03/19/16 0859  Weight: 270 lb 1 oz (122.5 kg)    GENERAL: Well-nourished well-developed; Alert, no distress and comfortable. He is alone.  EYES: no pallor or icterus OROPHARYNX: no thrush or ulceration; good dentition  NECK: supple, no masses felt LYMPH:  no palpable lymphadenopathy in the cervical, axillary or inguinal regions LUNGS: clear to auscultation and  No wheeze or crackles HEART/CVS: regular rate & rhythm and no murmurs; No lower extremity edema ABDOMEN: abdomen soft, non-tender and normal bowel sounds Musculoskeletal:no cyanosis of digits and no clubbing  PSYCH: alert & oriented x 3 with fluent speech NEURO: no focal motor/sensory deficits SKIN:  no rashes or significant lesions  LABORATORY DATA:  I have reviewed the data as listed Lab Results  Component Value Date   WBC 8.1 03/19/2016   HGB 16.0 03/19/2016   HCT 47.4 03/19/2016   MCV 91.7 03/19/2016   PLT 254 03/19/2016    Recent Labs  09/18/15 1356 03/19/16 0842  PROT 7.6 7.5  ALBUMIN 4.3 4.3  AST 32 29  ALT 53 46  ALKPHOS 73 66  BILITOT 0.9 0.9  BILIDIR 0.1 0.2  IBILI 0.8 0.7     ASSESSMENT & PLAN:   Primary hereditary hemochromatosis ([place]) # Hereditary hemochromatosis- H63D-heterozygous-Without any  evidence of iron overload/organ dysfunction. Recommend phlebotomy to keep the ferritin less than 200. Ferritin today is pending  # Patient will get follow-up CBC/iron studies/ferritin-few days prior/possible phlebotomy 6 months.     Cammie Sickle, MD 03/19/2016 9:21 AM

## 2016-04-03 ENCOUNTER — Inpatient Hospital Stay: Payer: BLUE CROSS/BLUE SHIELD

## 2016-04-03 DIAGNOSIS — E785 Hyperlipidemia, unspecified: Secondary | ICD-10-CM | POA: Diagnosis not present

## 2016-04-03 DIAGNOSIS — Z8042 Family history of malignant neoplasm of prostate: Secondary | ICD-10-CM | POA: Diagnosis not present

## 2016-09-17 ENCOUNTER — Inpatient Hospital Stay: Payer: BLUE CROSS/BLUE SHIELD | Attending: Internal Medicine

## 2016-09-17 DIAGNOSIS — Z8042 Family history of malignant neoplasm of prostate: Secondary | ICD-10-CM | POA: Diagnosis not present

## 2016-09-17 DIAGNOSIS — E785 Hyperlipidemia, unspecified: Secondary | ICD-10-CM | POA: Diagnosis not present

## 2016-09-17 LAB — COMPREHENSIVE METABOLIC PANEL
ALK PHOS: 69 U/L (ref 38–126)
ALT: 41 U/L (ref 17–63)
ANION GAP: 4 — AB (ref 5–15)
AST: 27 U/L (ref 15–41)
Albumin: 4.1 g/dL (ref 3.5–5.0)
BILIRUBIN TOTAL: 0.9 mg/dL (ref 0.3–1.2)
BUN: 16 mg/dL (ref 6–20)
CALCIUM: 8.9 mg/dL (ref 8.9–10.3)
CO2: 30 mmol/L (ref 22–32)
CREATININE: 1.06 mg/dL (ref 0.61–1.24)
Chloride: 103 mmol/L (ref 101–111)
GFR calc non Af Amer: >60 mL/min (ref >60)
GLUCOSE: 102 mg/dL — AB (ref 65–99)
Potassium: 4.4 mmol/L (ref 3.5–5.1)
Sodium: 137 mmol/L (ref 135–145)
TOTAL PROTEIN: 7.4 g/dL (ref 6.5–8.1)

## 2016-09-17 LAB — CBC WITH DIFFERENTIAL/PLATELET
Basophils Absolute: 0.1 10*3/uL (ref 0–0.1)
Basophils Relative: 1 %
Eosinophils Absolute: 0.1 10*3/uL (ref 0–0.7)
Eosinophils Relative: 1 %
HEMATOCRIT: 45.7 % (ref 40.0–52.0)
HEMOGLOBIN: 15.8 g/dL (ref 13.0–18.0)
LYMPHS ABS: 2.3 10*3/uL (ref 1.0–3.6)
LYMPHS PCT: 27 %
MCH: 31.3 pg (ref 26.0–34.0)
MCHC: 34.5 g/dL (ref 32.0–36.0)
MCV: 90.9 fL (ref 80.0–100.0)
Monocytes Absolute: 0.7 10*3/uL (ref 0.2–1.0)
Monocytes Relative: 9 %
NEUTROS ABS: 5.2 10*3/uL (ref 1.4–6.5)
NEUTROS PCT: 62 %
Platelets: 244 10*3/uL (ref 150–440)
RBC: 5.03 MIL/uL (ref 4.40–5.90)
RDW: 13 % (ref 11.5–14.5)
WBC: 8.4 10*3/uL (ref 3.8–10.6)

## 2016-09-17 LAB — FERRITIN: Ferritin: 101 ng/mL (ref 24–336)

## 2016-09-17 LAB — IRON AND TIBC
IRON: 103 ug/dL (ref 45–182)
Saturation Ratios: 32 % (ref 17.9–39.5)
TIBC: 322 ug/dL (ref 250–450)
UIBC: 219 ug/dL

## 2016-09-24 ENCOUNTER — Inpatient Hospital Stay: Payer: BLUE CROSS/BLUE SHIELD | Admitting: Internal Medicine

## 2016-09-24 ENCOUNTER — Inpatient Hospital Stay (HOSPITAL_BASED_OUTPATIENT_CLINIC_OR_DEPARTMENT_OTHER): Payer: BLUE CROSS/BLUE SHIELD | Admitting: Internal Medicine

## 2016-09-24 ENCOUNTER — Inpatient Hospital Stay: Payer: BLUE CROSS/BLUE SHIELD

## 2016-09-24 DIAGNOSIS — Z8042 Family history of malignant neoplasm of prostate: Secondary | ICD-10-CM

## 2016-09-24 DIAGNOSIS — E785 Hyperlipidemia, unspecified: Secondary | ICD-10-CM | POA: Diagnosis not present

## 2016-09-24 NOTE — Progress Notes (Signed)
[facility] NOTE  Patient Care Team: Owens Loffler, MD as PCP - General (Family Medicine)  CHIEF COMPLAINTS/PURPOSE OF CONSULTATION:   # OCT 2016 HEMOCHROMATOSIS CARRIER [H63D heterozygous; neg- c282y & s65c]; Ferritin 600-700; Iron sat 44%; on intermittent phlebotomy; Goal < 200.   HISTORY OF PRESENTING ILLNESS:  Ryan Calderon 46 y.o.  male with positive heterozygous hereditary hemochromatosis gene is here for follow-up. Patient had phlebotomy approximately Nov 2017.   Overall he feels okay. Otherwise denies any symptoms of leg swelling or abdominal distention.   ROS: A complete 10 point review of system is done which is negative except mentioned above in history of present illness  MEDICAL HISTORY:  Past Medical History:  Diagnosis Date  . Hereditary hemochromatosis   . Hyperlipidemia   . Pneumothorax    Complicated course, Dr. Delilah Shan followed    SURGICAL HISTORY: No past surgical history on file.  SOCIAL HISTORY: Social History   Social History  . Marital status: Married    Spouse name: N/A  . Number of children: N/A  . Years of education: N/A   Occupational History  . Not on file.   Social History Main Topics  . Smoking status: Never Smoker  . Smokeless tobacco: Current User    Types: Snuff     Comment: 31yrs with snuff  . Alcohol use 0.0 oz/week     Comment: ocassional beer  . Drug use: No  . Sexual activity: Yes    Partners: Female   Other Topics Concern  . Not on file   Social History Narrative  . No narrative on file    FAMILY HISTORY: Family History  Problem Relation Age of Onset  . Prostate cancer Father 72  . Diabetes Unknown   . Liver disease Brother     ALLERGIES:  is allergic to peanut-containing drug products.  MEDICATIONS:  No current outpatient prescriptions on file.   No current facility-administered medications for this visit.       Marland Kitchen  PHYSICAL EXAMINATION: ECOG PERFORMANCE STATUS: 0 -  Asymptomatic  Vitals:   09/24/16 1348  BP: 130/88  Pulse: 88  Resp: 18  Temp: 98.5 F (36.9 C)   Filed Weights   09/24/16 1348  Weight: 272 lb 6.1 oz (123.5 kg)    GENERAL: Well-nourished well-developed; Alert, no distress and comfortable. He is alone.  EYES: no pallor or icterus OROPHARYNX: no thrush or ulceration; good dentition  NECK: supple, no masses felt LYMPH:  no palpable lymphadenopathy in the cervical, axillary or inguinal regions LUNGS: clear to auscultation and  No wheeze or crackles HEART/CVS: regular rate & rhythm and no murmurs; No lower extremity edema ABDOMEN: abdomen soft, non-tender and normal bowel sounds Musculoskeletal:no cyanosis of digits and no clubbing  PSYCH: alert & oriented x 3 with fluent speech NEURO: no focal motor/sensory deficits SKIN:  no rashes or significant lesions  LABORATORY DATA:  I have reviewed the data as listed Lab Results  Component Value Date   WBC 8.4 09/17/2016   HGB 15.8 09/17/2016   HCT 45.7 09/17/2016   MCV 90.9 09/17/2016   PLT 244 09/17/2016    Recent Labs  03/19/16 0842 09/17/16 0936  NA  --  137  K  --  4.4  CL  --  103  CO2  --  30  GLUCOSE  --  102*  BUN  --  16  CREATININE  --  1.06  CALCIUM  --  8.9  GFRNONAA  --  >  60  GFRAA  --  >60  PROT 7.5 7.4  ALBUMIN 4.3 4.1  AST 29 27  ALT 46 41  ALKPHOS 66 69  BILITOT 0.9 0.9  BILIDIR 0.2  --   IBILI 0.7  --      ASSESSMENT & PLAN:   Primary hereditary hemochromatosis ([place]) # Hereditary hemochromatosis- H63D-heterozygous-Without any evidence of iron overload/organ dysfunction. Recommend phlebotomy to keep the ferritin less than 200. Patient continues to be asymptomatic. Patient's most recent ferritin is 100. Not recommend phlebotomy.  # Recommend iron studies/H&H possible phlebotomy in 6 months; follow-up with me labs/possible phlebotomy in 12 months. Labs a few days prior.     Ryan Sickle, MD 09/24/2016 6:06 PM

## 2016-09-24 NOTE — Progress Notes (Signed)
Here for follow up "forgot " am appointment.

## 2016-09-24 NOTE — Assessment & Plan Note (Addendum)
#   Hereditary hemochromatosis- H63D-heterozygous-Without any evidence of iron overload/organ dysfunction. Recommend phlebotomy to keep the ferritin less than 200. Patient continues to be asymptomatic. Patient's most recent ferritin is 100. Not recommend phlebotomy.  # Recommend iron studies/H&H possible phlebotomy in 6 months; follow-up with me labs/possible phlebotomy in 12 months. Labs a few days prior.

## 2017-01-07 DIAGNOSIS — C44319 Basal cell carcinoma of skin of other parts of face: Secondary | ICD-10-CM | POA: Diagnosis not present

## 2017-01-21 ENCOUNTER — Ambulatory Visit (INDEPENDENT_AMBULATORY_CARE_PROVIDER_SITE_OTHER): Payer: BLUE CROSS/BLUE SHIELD | Admitting: Family Medicine

## 2017-01-21 ENCOUNTER — Encounter: Payer: Self-pay | Admitting: Family Medicine

## 2017-01-21 VITALS — BP 120/80 | HR 93 | Temp 98.8°F | Ht 73.0 in | Wt 262.8 lb

## 2017-01-21 DIAGNOSIS — Z8042 Family history of malignant neoplasm of prostate: Secondary | ICD-10-CM

## 2017-01-21 DIAGNOSIS — I8390 Asymptomatic varicose veins of unspecified lower extremity: Secondary | ICD-10-CM

## 2017-01-21 DIAGNOSIS — Z0000 Encounter for general adult medical examination without abnormal findings: Secondary | ICD-10-CM

## 2017-01-21 DIAGNOSIS — Z8709 Personal history of other diseases of the respiratory system: Secondary | ICD-10-CM

## 2017-01-21 NOTE — Progress Notes (Signed)
CPE- See plan.  Routine anticipatory guidance given to patient.  See health maintenance.  The possibility exists that previously documented standard health maintenance information may have been brought forward from a previous encounter into this note.  If needed, that same information has been updated to reflect the current situation based on today's encounter.    Encouraged cessation of dip tobacco.   He thought he had a tetanus shot around 2015 with a foot injury.  PNA and shingles not due.  Flu shot done 01/14/17 Colon and prostate cancer screening not due.  Living will d/w pt.  Wife designated if patient were incapacitated.   [facility]     history of hemochromatosis. Per hematology. I'll defer. He agrees.   He declined epi pen for possible peanut allergy.    H/o PTX, complicated course but recovered.  No residual sx.    H/o BCC per derm, prev removed.    Dec libido noted.  "I'm not 21 like I used to be."  No SI/HI but he has less patience.  Sleep is good.  He does snore.  Occ waking up from snoring.  Not waking tired.  Diet and exercise d/w pt.   He is skipping lunch due to work.  Breakfast if limited due to his schedule.  "My diet sucks."  Exercise program d/w pt- "I don't have one (meaning a program)".  Varicose veins noted in the legs.  He was interested in getting intervention due to discomfort.  D/w pt about referral.    PMH and SH reviewed  Meds, vitals, and allergies reviewed.   ROS: Per HPI.  Unless specifically indicated otherwise in HPI, the patient denies:  General: fever. Eyes: acute vision changes ENT: sore throat Cardiovascular: chest pain Respiratory: SOB GI: vomiting GU: dysuria Musculoskeletal: acute back pain Derm: acute rash Neuro: acute motor dysfunction Psych: worsening mood Endocrine: polydipsia Heme: bleeding Allergy: hayfever  GEN: nad, alert and oriented HEENT: mucous membranes moist NECK: supple w/o LA CV:  rrr. PULM: ctab, no inc wob ABD: soft, +bs EXT: no edema SKIN: no acute rash B legs with varicose veins noted

## 2017-01-21 NOTE — Patient Instructions (Addendum)
Take care.  Glad to see you.  Ryan Calderon will call about your referral. If your snoring gets worse then let me know.  Work on weight loss in the meantime.  Total your calories in a day and cut 5% and eat lunch.  Goal 3 meals a day.   Get me a copy of your labs when they are resulted.

## 2017-01-22 ENCOUNTER — Encounter: Payer: Self-pay | Admitting: Family Medicine

## 2017-01-22 DIAGNOSIS — Z0000 Encounter for general adult medical examination without abnormal findings: Secondary | ICD-10-CM | POA: Insufficient documentation

## 2017-01-22 DIAGNOSIS — Z8709 Personal history of other diseases of the respiratory system: Secondary | ICD-10-CM | POA: Insufficient documentation

## 2017-01-22 DIAGNOSIS — Z8042 Family history of malignant neoplasm of prostate: Secondary | ICD-10-CM | POA: Insufficient documentation

## 2017-01-22 DIAGNOSIS — I8390 Asymptomatic varicose veins of unspecified lower extremity: Secondary | ICD-10-CM | POA: Insufficient documentation

## 2017-01-22 NOTE — Assessment & Plan Note (Signed)
Per hematology. I'll defer. He agrees.

## 2017-01-22 NOTE — Assessment & Plan Note (Signed)
Refer

## 2017-01-22 NOTE — Assessment & Plan Note (Addendum)
Encouraged cessation of dip tobacco.   He thought he had a tetanus shot around 2015 with a foot injury.  PNA and shingles not due.  Flu shot done 01/14/17 Colon and prostate cancer screening not due.  Living will d/w pt.  Wife designated if patient were incapacitated.   [facility]    Dec libido noted.  "I'm not 21 like I used to be."  No SI/HI but he has less patience.  Sleep is good.  He does snore.  Occ waking up from snoring.  Not waking tired.  Diet and exercise d/w pt.   He is skipping lunch due to work.  Breakfast if limited due to his schedule.  "My diet sucks."  Exercise program d/w pt- "I don't have one (meaning a program)".  At this point he most likely needs work on diet and exercise and weight reduction. If that does not help his mood, sleep, energy level, then he can update me and we will go from there. He agrees. See AVS.  He had outside labs recently done and will get me a copy.

## 2017-02-04 ENCOUNTER — Encounter (INDEPENDENT_AMBULATORY_CARE_PROVIDER_SITE_OTHER): Payer: Self-pay | Admitting: Vascular Surgery

## 2017-02-04 ENCOUNTER — Ambulatory Visit (INDEPENDENT_AMBULATORY_CARE_PROVIDER_SITE_OTHER): Payer: BLUE CROSS/BLUE SHIELD | Admitting: Vascular Surgery

## 2017-02-04 VITALS — BP 113/78 | HR 80 | Resp 17 | Ht 73.0 in | Wt 267.0 lb

## 2017-02-04 DIAGNOSIS — I83813 Varicose veins of bilateral lower extremities with pain: Secondary | ICD-10-CM

## 2017-02-04 DIAGNOSIS — R600 Localized edema: Secondary | ICD-10-CM | POA: Diagnosis not present

## 2017-02-04 NOTE — Progress Notes (Signed)
Subjective:    Patient ID: Ryan Calderon, male    DOB: July 19, 1970, 46 y.o.   MRN: [id number] Chief Complaint  Patient presents with  . New Patient (Initial Visit)    Varicose Veins   Presents as a new patient referred by Dr. Damita Calderon for "varicose veins". Patient endorses a long-standing history of symptomatic bilateral lower extremity varicose veins. States that they have progressively become larger in size and more painful. Patient is active and does a lot of standing and sitting for work. Experiences bilateral lower extremity edema. States the edema worsens towards the end of the day. The edema is associated with discomfort. He describes this discomfort as a "tiredness" and "aching". Patient denies any claudication-like symptoms rest pain or ulceration to the bilateral lower extremity. Patient denies any trauma or surgery/procedure to the lower extremity. The patient's symptoms have progressive the point he is unable to function on a daily basis due to the discomfort requiring over-the-counter use of anti-inflammatories with minimal relief. Patient denies any fever, nausea or vomiting.   Review of Systems  Constitutional: Negative.   HENT: Negative.   Eyes: Negative.   Respiratory: Negative.   Cardiovascular: Positive for leg swelling.       Bilateral varicose veins  Gastrointestinal: Negative.   Endocrine: Negative.   Genitourinary: Negative.   Musculoskeletal: Negative.   Skin: Negative.   Allergic/Immunologic: Negative.   Neurological: Negative.   Hematological: Negative.   Psychiatric/Behavioral: Negative.       Objective:   Physical Exam  Constitutional: He is oriented to person, place, and time. He appears well-developed and well-nourished. No distress.  HENT:  Head: Normocephalic and atraumatic.  Eyes: Pupils are equal, round, and reactive to light. Conjunctivae are normal.  Neck: Normal range of motion.  Cardiovascular: Normal rate, regular rhythm, normal heart sounds and  intact distal pulses.   Pulses:      Radial pulses are 2+ on the right side, and 2+ on the left side.       Dorsalis pedis pulses are 2+ on the right side, and 2+ on the left side.       Posterior tibial pulses are 2+ on the right side, and 2+ on the left side.  Pulmonary/Chest: Effort normal.  Musculoskeletal: Normal range of motion. He exhibits edema (mild bilateral lower extremity edema noted).  Neurological: He is alert and oriented to person, place, and time.  Skin: He is not diaphoretic.  Right lower extremity: estimate 1 cm scattered varicosities. No stasis dermatitis noted. Left lower extremity: very prominent tender greater than 1 cm varicosities noted. No stasis dermatitis noted.  Psychiatric: He has a normal mood and affect. His behavior is normal. Judgment and thought content normal.  Vitals reviewed.   BP 113/78 (BP Location: Right Arm)   Pulse 80   Resp 17   Ht 6\' 1"  (1.854 m)   Wt 267 lb (121.1 kg)   BMI 35.23 kg/m   Past Medical History:  Diagnosis Date  . BCC (basal cell carcinoma of skin)   . Hereditary hemochromatosis   . Hyperlipidemia   . Pneumothorax    Complicated course, Dr. Delilah Calderon followed    Social History   Social History  . Marital status: Married    Spouse name: N/A  . Number of children: N/A  . Years of education: N/A   Occupational History  . Not on file.   Social History Main Topics  . Smoking status: Never Smoker  . Smokeless tobacco: Current User  Types: Snuff     Comment: 61yrs with snuff  . Alcohol use 0.0 oz/week     Comment: ocassional beer  . Drug use: No  . Sexual activity: Yes    Partners: Female   Other Topics Concern  . Not on file   Social History Narrative   Married Ryan Calderon ([place])   2 sons (born 2000, 2002)   Ryan Calderon  [phone].  [employer].  Explosive ordinance disposal, forward air controller.    No past surgical history on file.  Family History  Problem  Relation Age of Onset  . Prostate cancer Father 23  . COPD Father        smoker  . Diabetes Unknown   . Liver disease Brother     Allergies  Allergen Reactions  . Peanut-Containing Drug Products Other (See Comments)    Patient allergic to peanuts - states it make him unable to talk above a whisper.       Assessment & Plan:  Presents as a new patient referred by Dr. Damita Calderon for "varicose veins". Patient endorses a long-standing history of symptomatic bilateral lower extremity varicose veins. States that they have progressively become larger in size and more painful. Patient is active and does a lot of standing and sitting for work. Experiences bilateral lower extremity edema. States the edema worsens towards the end of the day. The edema is associated with discomfort. He describes this discomfort as a "tiredness" and "aching". Patient denies any claudication-like symptoms rest pain or ulceration to the bilateral lower extremity. Patient denies any trauma or surgery/procedure to the lower extremity. The patient's symptoms have progressive the point he is unable to function on a daily basis due to the discomfort requiring over-the-counter use of anti-inflammatories with minimal relief. Patient denies any fever, nausea or vomiting.  1. Varicose veins of both lower extremities with pain - new The patient was encouraged to wear graduated compression stockings (20-30 mmHg) on a daily basis. The patient was instructed to begin wearing the stockings first thing in the morning and removing them in the evening. The patient was instructed specifically not to sleep in the stockings. Prescription given In addition, behavioral modification including elevation during the day will be initiated. Anti-inflammatories for pain. The patient will follow up in three months to asses conservative management.  Information on chronic venous insufficiency and compression stockings was given to the patient. The patient was  instructed to call the office in the interim if any worsening edema or ulcerations to the legs, feet or toes occurs. The patient expresses their understanding.  - VAS [place] LOWER EXTREMITY VENOUS REFLUX; Future  2. Bilateral lower extremity edema - New As above  No current outpatient prescriptions on file prior to visit.   No current facility-administered medications on file prior to visit.     There are no Patient Instructions on file for this visit. No Follow-up on file.   KIMBERLY A STEGMAYER, PA-C

## 2017-02-11 DIAGNOSIS — L814 Other melanin hyperpigmentation: Secondary | ICD-10-CM | POA: Diagnosis not present

## 2017-02-11 DIAGNOSIS — L578 Other skin changes due to chronic exposure to nonionizing radiation: Secondary | ICD-10-CM | POA: Diagnosis not present

## 2017-02-11 DIAGNOSIS — C44111 Basal cell carcinoma of skin of unspecified eyelid, including canthus: Secondary | ICD-10-CM | POA: Diagnosis not present

## 2017-03-20 ENCOUNTER — Telehealth: Payer: Self-pay | Admitting: *Deleted

## 2017-03-20 ENCOUNTER — Inpatient Hospital Stay: Payer: BLUE CROSS/BLUE SHIELD | Attending: Internal Medicine

## 2017-03-20 LAB — HEMOGLOBIN: HEMOGLOBIN: 16.2 g/dL (ref 13.0–18.0)

## 2017-03-20 LAB — FERRITIN: Ferritin: 180 ng/mL (ref 24–336)

## 2017-03-20 LAB — IRON AND TIBC
Iron: 73 ug/dL (ref 45–182)
Saturation Ratios: 22 % (ref 17.9–39.5)
TIBC: 325 ug/dL (ref 250–450)
UIBC: 252 ug/dL

## 2017-03-20 LAB — HEMATOCRIT: HCT: 47.4 % (ref 40.0–52.0)

## 2017-03-20 NOTE — Telephone Encounter (Signed)
-----   Message from Cephus Richer sent at 03/20/2017  1:33 PM EST ----- Regarding: lab Hey Ryan Calderon would like for someone to call him to let him know if he needs to come get a phlebotomy on Tuesday.  Thanks

## 2017-03-20 NOTE — Telephone Encounter (Addendum)
Reviewed lab results with pt. His ferritin is 180. Based on Dr. Aletha Halim note, pt informed that per md's last note the goals 200. Most likely, phlebotomy will not be needed. Pt desires to cnl this apt next week if md will be ok with this. Will f/u with md in am to determine when md would like to f/u with patient.

## 2017-03-21 NOTE — Telephone Encounter (Signed)
Per Dr. Jacinto Reap, it is okay to cancel appt next week for phlebotomy; follow up/labs as planned in may 2019. Thanks!

## 2017-03-24 NOTE — Telephone Encounter (Signed)
md approved to r/s his apts to May. Pt has already been notified.

## 2017-05-14 ENCOUNTER — Ambulatory Visit (INDEPENDENT_AMBULATORY_CARE_PROVIDER_SITE_OTHER): Payer: BLUE CROSS/BLUE SHIELD

## 2017-05-14 ENCOUNTER — Ambulatory Visit (INDEPENDENT_AMBULATORY_CARE_PROVIDER_SITE_OTHER): Payer: BLUE CROSS/BLUE SHIELD | Admitting: Vascular Surgery

## 2017-05-14 ENCOUNTER — Encounter (INDEPENDENT_AMBULATORY_CARE_PROVIDER_SITE_OTHER): Payer: Self-pay | Admitting: Vascular Surgery

## 2017-05-14 VITALS — BP 125/77 | HR 85 | Resp 15 | Ht 71.0 in | Wt 268.0 lb

## 2017-05-14 DIAGNOSIS — R600 Localized edema: Secondary | ICD-10-CM

## 2017-05-14 DIAGNOSIS — I872 Venous insufficiency (chronic) (peripheral): Secondary | ICD-10-CM | POA: Diagnosis not present

## 2017-05-14 DIAGNOSIS — I83813 Varicose veins of bilateral lower extremities with pain: Secondary | ICD-10-CM

## 2017-05-14 NOTE — Progress Notes (Signed)
Subjective:    Patient ID: Ryan Calderon, male    DOB: [birth year], 47 y.o.   MRN: [id number] Chief Complaint  Patient presents with  . Follow-up    3 month venous reflux   The patient presents to review vascular studies.  The patient was seen approximately 3 months ago for an initial evaluation of painful varicose veins and lower extremity edema.  Since his initial visit, the patient has not been wearing medical grade 1 compression stockings or elevating his legs on a daily basis.  The patient's symptoms are stable and continue to bother him.  The patient notes that his symptoms have progressed to the point he is unable to function on a daily basis.  The patient states his symptoms are lifestyle limiting.  The patient underwent a bilateral lower extremity venous duplex which was notable for reflux in the bilateral femoral veins.  No deep vein thrombosis or superficial thrombophlebitis noted.  The patient denies any fever, nausea or vomiting.   Review of Systems  Constitutional: Negative.   HENT: Negative.   Eyes: Negative.   Respiratory: Negative.   Cardiovascular: Positive for leg swelling.       Painful varicose veins  Gastrointestinal: Negative.   Endocrine: Negative.   Genitourinary: Negative.   Musculoskeletal: Negative.   Skin: Negative.   Allergic/Immunologic: Negative.   Neurological: Negative.   Hematological: Negative.   Psychiatric/Behavioral: Negative.       Objective:   Physical Exam  Constitutional: He is oriented to person, place, and time. He appears well-developed and well-nourished. No distress.  HENT:  Head: Normocephalic and atraumatic.  Eyes: Conjunctivae are normal. Pupils are equal, round, and reactive to light.  Neck: Normal range of motion.  Cardiovascular: Normal rate, regular rhythm, normal heart sounds and intact distal pulses.  Pulses:      Radial pulses are 2+ on the right side, and 2+ on the left side.       Dorsalis pedis pulses are 2+ on the  right side, and 2+ on the left side.       Posterior tibial pulses are 2+ on the right side, and 2+ on the left side.  Pulmonary/Chest: Effort normal and breath sounds normal.  Musculoskeletal: Normal range of motion. He exhibits edema (Mild bilateral lower extremity edema nonpitting noted).  Neurological: He is alert and oriented to person, place, and time.  Skin: He is not diaphoretic.  Right lower extremity: estimate 1 cm scattered varicosities. No stasis dermatitis noted. Left lower extremity: very prominent tender greater than 1 cm varicosities noted. No stasis dermatitis noted.  Psychiatric: He has a normal mood and affect. His behavior is normal. Judgment and thought content normal.  Vitals reviewed.  BP 125/77 (BP Location: Right Arm, Patient Position: Sitting)   Pulse 85   Resp 15   Ht 5\' 11"  (1.803 m)   Wt 268 lb (121.6 kg)   BMI 37.38 kg/m   Past Medical History:  Diagnosis Date  . BCC (basal cell carcinoma of skin)   . Hereditary hemochromatosis   . Hyperlipidemia   . Pneumothorax    Complicated course, Dr. Delilah Shan followed   Social History   Socioeconomic History  . Marital status: Married    Spouse name: Not on file  . Number of children: Not on file  . Years of education: Not on file  . Highest education level: Not on file  Social Needs  . Financial resource strain: Not on file  . Food insecurity -  worry: Not on file  . Food insecurity - inability: Not on file  . Transportation needs - medical: Not on file  . Transportation needs - non-medical: Not on file  Occupational History  . Not on file  Tobacco Use  . Smoking status: Never Smoker  . Smokeless tobacco: Current User    Types: Snuff  . Tobacco comment: 52yrs with snuff  Substance and Sexual Activity  . Alcohol use: Yes    Alcohol/week: 0.0 oz    Comment: ocassional beer  . Drug use: No  . Sexual activity: Yes    Partners: Female  Other Topics Concern  . Not on file  Social History Narrative    Married [place] ([place] [employer] projects)   2 sons (born 2000, 2002)   Ellis Savage  [phone].  [employer].  Explosive ordinance disposal, forward air controller.   No past surgical history on file.  Family History  Problem Relation Age of Onset  . Prostate cancer Father 60  . COPD Father        smoker  . Diabetes Unknown   . Liver disease Brother    Allergies  Allergen Reactions  . Peanut-Containing Drug Products Other (See Comments)    Patient allergic to peanuts - states it make him unable to talk above a whisper.      Assessment & Plan:  The patient presents to review vascular studies.  The patient was seen approximately 3 months ago for an initial evaluation of painful varicose veins and lower extremity edema.  Since his initial visit, the patient has not been wearing medical grade 1 compression stockings or elevating his legs on a daily basis.  The patient's symptoms are stable and continue to bother him.  The patient notes that his symptoms have progressed to the point he is unable to function on a daily basis.  The patient states his symptoms are lifestyle limiting.  The patient underwent a bilateral lower extremity venous duplex which was notable for reflux in the bilateral femoral veins.  No deep vein thrombosis or superficial thrombophlebitis noted.  The patient denies any fever, nausea or vomiting.  1. Chronic venous insufficiency - New Patient was found to have venous reflux in the bilateral femoral veins. The patient has not been engaging in conservative therapy since our initial visit The patient was encouraged to wear graduated compression stockings (20-30 mmHg) on a daily basis. The patient was instructed to begin wearing the stockings first thing in the morning and removing them in the evening. The patient was instructed specifically not to sleep in the stockings.  In addition, behavioral modification including elevation during the day will be  initiated. Anti-inflammatories for pain. The patient would like to follow-up as needed. .  The patient was instructed to call the office in the interim if any worsening edema or ulcerations to the legs, feet or toes occurs. The patient expresses their understanding.  2. Varicose veins of both lower extremities with pain - Stable As above  3. Bilateral lower extremity edema - Stable As above  No current outpatient medications on file prior to visit.   No current facility-administered medications on file prior to visit.    There are no Patient Instructions on file for this visit. No Follow-up on file.  Jaire Pinkham A Artur Winningham, PA-C

## 2017-07-31 ENCOUNTER — Ambulatory Visit: Payer: BLUE CROSS/BLUE SHIELD | Admitting: Family Medicine

## 2017-08-08 ENCOUNTER — Encounter: Payer: Self-pay | Admitting: Family Medicine

## 2017-08-08 ENCOUNTER — Ambulatory Visit: Payer: BLUE CROSS/BLUE SHIELD | Admitting: Family Medicine

## 2017-08-08 VITALS — BP 122/80 | HR 72 | Temp 98.3°F | Wt 265.8 lb

## 2017-08-08 DIAGNOSIS — I872 Venous insufficiency (chronic) (peripheral): Secondary | ICD-10-CM

## 2017-08-08 NOTE — Progress Notes (Signed)
He has f/u with hematology for 09/23/17.  He has been able to space out his visits after initial repeated phlebotomy.  He has noted some fatigue and joint aches in the interval.    He had a recent cold with some residual cough but is gradually improving.    Diet and exercise d/w pt.  He has worked on diet in the meantime.  He has intentional weight loss.  He is busy with family and school commitments.  His sleep is better with the weight loss.    We talked about his libido, which is lower than prev.  He is still functional, ie not disabling ED.  We talked about T replacement and issues with his blood counts, meaning I wouldn't start replacement given the risk of HGB elevation.  He agrees.    He has continued trouble with ropy varicose veins especially in the left thigh.  No bleeding.  Meds, vitals, and allergies reviewed.   ROS: Per HPI unless specifically indicated in ROS section   GEN: nad, alert and oriented HEENT: mucous membranes moist NECK: supple w/o LA CV: rrr. PULM: ctab, no inc wob ABD: soft, +bs EXT: no edema SKIN: no acute rash Ropy L thigh varicose veins.

## 2017-08-08 NOTE — Patient Instructions (Signed)
I'll check with the vein clinic in the meantime.  I'll await the notes from the blood clinic.  You don't need labs done today.  Take care.  Glad to see you.  Update me as needed.   Keep working on your weight in the meantime. Recheck labs prior to a physical in the fall.

## 2017-08-09 NOTE — Assessment & Plan Note (Signed)
I will check with the vascular clinic to see if he is a candidate for any procedure.

## 2017-08-09 NOTE — Assessment & Plan Note (Signed)
Discussed with patient.  He has hematology follow-up pending.  I do not think it would be a great idea for him to start testosterone replacement even if he had a low testosterone level given the changes that he could have in his hemoglobin.  Patient.  He agreed.

## 2017-08-11 ENCOUNTER — Telehealth: Payer: Self-pay | Admitting: Family Medicine

## 2017-08-11 NOTE — Telephone Encounter (Signed)
Patient advised.

## 2017-08-11 NOTE — Telephone Encounter (Signed)
Call pt.  I checked with vascular.  He would not be a candidate for any type of endovenous laser ablation as his reflux is in the deep system not the superficial system, meaning the issue originated in the deep veins, not the superficial veins that he can feel in the skin.  The only options would be compression stockings followed by a lymphedema pump.  He's have to try (and fail) with compression stockings before he'd be a candidate for the pump.  Thanks.

## 2017-09-18 ENCOUNTER — Inpatient Hospital Stay: Payer: BLUE CROSS/BLUE SHIELD | Attending: Internal Medicine

## 2017-09-18 ENCOUNTER — Other Ambulatory Visit: Payer: Self-pay

## 2017-09-18 ENCOUNTER — Other Ambulatory Visit
Admission: RE | Admit: 2017-09-18 | Discharge: 2017-09-18 | Disposition: A | Discharge status: Home / Self Care | Payer: BLUE CROSS/BLUE SHIELD | Source: Ambulatory Visit | Attending: Internal Medicine | Admitting: Internal Medicine

## 2017-09-18 DIAGNOSIS — R5383 Other fatigue: Secondary | ICD-10-CM | POA: Insufficient documentation

## 2017-09-18 LAB — COMPREHENSIVE METABOLIC PANEL
ALBUMIN: 4.3 g/dL (ref 3.5–5.0)
ALT: 36 U/L (ref 17–63)
ANION GAP: 12 (ref 5–15)
AST: 30 U/L (ref 15–41)
Alkaline Phosphatase: 65 U/L (ref 38–126)
BILIRUBIN TOTAL: 1.5 mg/dL — AB (ref 0.3–1.2)
BUN: 24 mg/dL — ABNORMAL HIGH (ref 6–20)
CO2: 26 mmol/L (ref 22–32)
Calcium: 9.2 mg/dL (ref 8.9–10.3)
Chloride: 100 mmol/L — ABNORMAL LOW (ref 101–111)
Creatinine, Ser: 1.19 mg/dL (ref 0.61–1.24)
GLUCOSE: 153 mg/dL — AB (ref 65–99)
POTASSIUM: 3.8 mmol/L (ref 3.5–5.1)
Sodium: 138 mmol/L (ref 135–145)
TOTAL PROTEIN: 7.7 g/dL (ref 6.5–8.1)

## 2017-09-18 LAB — CBC WITH DIFFERENTIAL/PLATELET
BASOS ABS: 0.1 10*3/uL (ref 0–0.1)
Basophils Relative: 1 %
Eosinophils Absolute: 0.1 10*3/uL (ref 0–0.7)
Eosinophils Relative: 1 %
HEMATOCRIT: 43.7 % (ref 40.0–52.0)
HEMOGLOBIN: 15.1 g/dL (ref 13.0–18.0)
LYMPHS PCT: 28 %
Lymphs Abs: 2.7 10*3/uL (ref 1.0–3.6)
MCH: 31.8 pg (ref 26.0–34.0)
MCHC: 34.6 g/dL (ref 32.0–36.0)
MCV: 91.9 fL (ref 80.0–100.0)
Monocytes Absolute: 0.8 10*3/uL (ref 0.2–1.0)
Monocytes Relative: 9 %
NEUTROS ABS: 6 10*3/uL (ref 1.4–6.5)
NEUTROS PCT: 61 %
Platelets: 241 10*3/uL (ref 150–440)
RBC: 4.76 MIL/uL (ref 4.40–5.90)
RDW: 12.8 % (ref 11.5–14.5)
WBC: 9.7 10*3/uL (ref 3.8–10.6)

## 2017-09-18 LAB — FERRITIN: Ferritin: 171 ng/mL (ref 24–336)

## 2017-09-18 LAB — IRON AND TIBC
Iron: 133 ug/dL (ref 45–182)
SATURATION RATIOS: 41 % — AB (ref 17.9–39.5)
TIBC: 323 ug/dL (ref 250–450)
UIBC: 190 ug/dL

## 2017-09-23 ENCOUNTER — Inpatient Hospital Stay: Payer: BLUE CROSS/BLUE SHIELD

## 2017-09-23 ENCOUNTER — Encounter: Payer: Self-pay | Admitting: Internal Medicine

## 2017-09-23 ENCOUNTER — Other Ambulatory Visit: Payer: Self-pay

## 2017-09-23 ENCOUNTER — Inpatient Hospital Stay: Payer: BLUE CROSS/BLUE SHIELD | Attending: Internal Medicine | Admitting: Internal Medicine

## 2017-09-23 DIAGNOSIS — R5383 Other fatigue: Secondary | ICD-10-CM | POA: Insufficient documentation

## 2017-09-23 NOTE — Patient Instructions (Signed)

## 2017-09-23 NOTE — Progress Notes (Signed)
[facility] NOTE  Patient Care Team: Tonia Ghent, MD as PCP - General (Family Medicine)  CHIEF COMPLAINTS/PURPOSE OF CONSULTATION:   # OCT 2016 HEMOCHROMATOSIS CARRIER [H63D heterozygous; neg- c282y & s65c]; Ferritin 600-700; Iron sat 44%; on intermittent phlebotomy; Goal < 200.   HISTORY OF PRESENTING ILLNESS:  Ryan Calderon 47 y.o.  male with positive heterozygous hereditary hemochromatosis gene is here for follow-up.   Patient complains of ongoing fatigue for the last many months.  He feels this could be related to his elevated iron levels.  In the past he had noted improvement of his energy levels after phlebotomy.  Patient does admit to snoring at night.  He denies any headaches.  Denies any swelling of the legs.  Review of Systems  Constitutional: Positive for malaise/fatigue. Negative for chills, diaphoresis, fever and weight loss.  HENT: Negative for nosebleeds and sore throat.   Eyes: Negative for double vision.  Respiratory: Negative for cough, hemoptysis, sputum production, shortness of breath and wheezing.   Cardiovascular: Negative for chest pain, palpitations, orthopnea and leg swelling.  Gastrointestinal: Negative for abdominal pain, blood in stool, constipation, diarrhea, heartburn, melena, nausea and vomiting.  Genitourinary: Negative for dysuria, frequency and urgency.  Musculoskeletal: Negative for back pain and joint pain.  Skin: Negative.  Negative for itching and rash.  Neurological: Negative for dizziness, tingling, focal weakness, weakness and headaches.  Endo/Heme/Allergies: Does not bruise/bleed easily.  Psychiatric/Behavioral: Negative for depression. The patient is not nervous/anxious and does not have insomnia.      ROS: A complete 10 point review of system is done which is negative except mentioned above in history of present illness  MEDICAL HISTORY:  Past Medical History:  Diagnosis Date  . BCC (basal cell carcinoma of  skin)   . Hereditary hemochromatosis   . Hyperlipidemia   . Pneumothorax    Complicated course, Dr. Delilah Shan followed    SURGICAL HISTORY: History reviewed. No pertinent surgical history.  SOCIAL HISTORY: Social History   Socioeconomic History  . Marital status: Married    Spouse name: Not on file  . Number of children: Not on file  . Years of education: Not on file  . Highest education level: Not on file  Occupational History  . Not on file  Social Needs  . Financial resource strain: Not on file  . Food insecurity:    Worry: Not on file    Inability: Not on file  . Transportation needs:    Medical: Not on file    Non-medical: Not on file  Tobacco Use  . Smoking status: Never Smoker  . Smokeless tobacco: Current User    Types: Snuff  . Tobacco comment: 58yrs with snuff  Substance and Sexual Activity  . Alcohol use: Yes    Alcohol/week: 0.0 oz    Comment: ocassional beer  . Drug use: No  . Sexual activity: Yes    Partners: Female  Lifestyle  . Physical activity:    Days per week: Not on file    Minutes per session: Not on file  . Stress: Not on file  Relationships  . Social connections:    Talks on phone: Not on file    Gets together: Not on file    Attends religious service: Not on file    Active member of club or organization: Not on file    Attends meetings of clubs or organizations: Not on file    Relationship status: Not on file  .  Intimate partner violence:    Fear of current or ex partner: Not on file    Emotionally abused: Not on file    Physically abused: Not on file    Forced sexual activity: Not on file  Other Topics Concern  . Not on file  Social History Narrative   Married [place] ([employer] projects)   2 sons (born 2000, 2002)   Ellis Savage  [phone].  [employer].  Explosive ordinance disposal, forward air controller.    FAMILY HISTORY: Family History  Problem Relation Age of Onset  . Prostate cancer Father 92   . COPD Father        smoker  . Diabetes Unknown   . Liver disease Brother     ALLERGIES:  is allergic to peanut-containing drug products.  MEDICATIONS:  No current outpatient medications on file.   No current facility-administered medications for this visit.       Marland Kitchen  PHYSICAL EXAMINATION: ECOG PERFORMANCE STATUS: 0 - Asymptomatic  Vitals:   09/23/17 1441 09/23/17 1445  BP: 131/74 131/74  Pulse: 87 87  Resp: 20 20  Temp: 98.3 F (36.8 C) 98.4 F (36.9 C)   Filed Weights   09/23/17 1441  Weight: 265 lb (120.2 kg)    GENERAL: Well-nourished well-developed; Alert, no distress and comfortable.  He is alone. EYES: no pallor or icterus OROPHARYNX: no thrush or ulceration; NECK: supple; no lymph nodes felt. LYMPH:  no palpable lymphadenopathy in the axillary or inguinal regions LUNGS: Decreased breath sounds auscultation bilaterally. No wheeze or crackles HEART/CVS: regular rate & rhythm and no murmurs; No lower extremity edema ABDOMEN:abdomen soft, non-tender and normal bowel sounds. No hepatomegaly or splenomegaly.  Musculoskeletal:no cyanosis of digits and no clubbing  PSYCH: alert & oriented x 3 with fluent speech NEURO: no focal motor/sensory deficits SKIN:  no rashes or significant lesions  LABORATORY DATA:  I have reviewed the data as listed Lab Results  Component Value Date   WBC 9.7 09/18/2017   HGB 15.1 09/18/2017   HCT 43.7 09/18/2017   MCV 91.9 09/18/2017   PLT 241 09/18/2017   Recent Labs    09/18/17 1507  NA 138  K 3.8  CL 100*  CO2 26  GLUCOSE 153*  BUN 24*  CREATININE 1.19  CALCIUM 9.2  GFRNONAA >60  GFRAA >60  PROT 7.7  ALBUMIN 4.3  AST 30  ALT 36  ALKPHOS 65  BILITOT 1.5*     ASSESSMENT & PLAN:   Primary hereditary hemochromatosis ([place]) # Hereditary hemochromatosis- H63D-heterozygous-no evidence of endorgan dysfunction on iron overload.  Iron saturation 41%; patient feels fatigued-proceed with phlebotomy  today.  #Fatigue-question as above; however I think patient's fatigue could be from obstructive sleep apnea; recommend sleep study.  Defer to PCP  #CBC phlebotomy in 6 months; CBC and studies ferritin-1 week prior/MD/possible phlebotomy 12 months    Cammie Sickle, MD 09/23/2017 3:28 PM

## 2017-09-23 NOTE — Assessment & Plan Note (Addendum)
#   Hereditary hemochromatosis- H63D-heterozygous-no evidence of endorgan dysfunction on iron overload.  Iron saturation 41%; patient feels fatigued-proceed with phlebotomy today.  #Fatigue-question as above; however I think patient's fatigue could be from obstructive sleep apnea; recommend sleep study.  Defer to PCP  #CBC phlebotomy in 6 months; CBC and studies ferritin-1 week prior/MD/possible phlebotomy 12 months

## 2017-09-24 ENCOUNTER — Telehealth: Payer: Self-pay | Admitting: Family Medicine

## 2017-09-24 NOTE — Telephone Encounter (Signed)
Notify pt.  If patient continues to have sig snoring and fatigue that doesn't improve with phlebotomy, then have him let me know so we can refer him for sleep apnea testing.  Thanks.

## 2017-09-24 NOTE — Telephone Encounter (Signed)
Left message on voicemail for patient to call back. 

## 2017-09-24 NOTE — Telephone Encounter (Signed)
Patient notified as instructed by telephone and verbalized understanding. 

## 2018-01-30 ENCOUNTER — Encounter: Payer: Self-pay | Admitting: Family Medicine

## 2018-01-30 ENCOUNTER — Ambulatory Visit (INDEPENDENT_AMBULATORY_CARE_PROVIDER_SITE_OTHER): Payer: BLUE CROSS/BLUE SHIELD | Admitting: Family Medicine

## 2018-01-30 VITALS — BP 122/84 | HR 89 | Temp 98.2°F | Ht 71.0 in | Wt 269.5 lb

## 2018-01-30 DIAGNOSIS — Z7189 Other specified counseling: Secondary | ICD-10-CM

## 2018-01-30 DIAGNOSIS — Z0000 Encounter for general adult medical examination without abnormal findings: Secondary | ICD-10-CM

## 2018-01-30 NOTE — Patient Instructions (Addendum)
Call the dermatology clinic about a routine follow up.  If the abdominal wall pain continues, then let me know.   Take care.  Glad to see you.  I'll await your labs.

## 2018-01-30 NOTE — Progress Notes (Signed)
CPE- See plan.  Routine anticipatory guidance given to patient.  See health maintenance.  The possibility exists that previously documented standard health maintenance information may have been brought forward from a previous encounter into this note.  If needed, that same information has been updated to reflect the current situation based on today's encounter.    Encouraged cessation of dip tobacco.  d/w pt.   He thought he had a tetanus shot around 2015 with a foot injury.  PNA and shingles not due.  Flu shot to be done next week.   Colon and prostate cancer screening not due.  D/w pt.    Living will d/w pt.  Wife designated if patient were incapacitated.   [facility].  Diet and exercise d/w pt.    He'll get labs done at wife's job next week.  I'll await those.  He agrees.    History of hemochromatosis. Per hematology. I'll defer. He agrees.  He has f/u pending. He avoids red meat.    He has occ RUQ tightness, at the lower edge of the ribs in L midclavicular line.  Noted with bending over, "like I pulled something".  No CP, SOB.   First noted when tying shoes.  Rare sx, happened a few times, meaning 3 brief episodes over the last few months when he was leaning forward.  Not exertional.  PMH and SH reviewed  Meds, vitals, and allergies reviewed.   ROS: Per HPI.  Unless specifically indicated otherwise in HPI, the patient denies:  General: fever. Eyes: acute vision changes ENT: sore throat Cardiovascular: chest pain Respiratory: SOB GI: vomiting GU: dysuria Musculoskeletal: acute back pain Derm: acute rash Neuro: acute motor dysfunction Psych: worsening mood Endocrine: polydipsia Heme: bleeding Allergy: hayfever  GEN: nad, alert and oriented HEENT: mucous membranes moist NECK: supple w/o LA CV: rrr. PULM: ctab, no inc wob ABD: soft, +bs EXT: no edema SKIN: no acute rash Abdominal wall not tender.

## 2018-02-01 DIAGNOSIS — Z7189 Other specified counseling: Secondary | ICD-10-CM | POA: Insufficient documentation

## 2018-02-01 NOTE — Assessment & Plan Note (Signed)
Per hematology.  I will defer.  He agrees.

## 2018-02-01 NOTE — Assessment & Plan Note (Addendum)
Encouraged cessation of dip tobacco.  d/w pt.   He thought he had a tetanus shot around 2015 with a foot injury.  PNA and shingles not due.  Flu shot to be done next week.   Colon and prostate cancer screening not due.  D/w pt.    Living will d/w pt.  Wife designated if patient were incapacitated.   [facility].  Diet and exercise d/w pt.    He likely had abdominal wall irritation with positional symptoms.  No exertional symptoms.  Discussed.  Relevant anatomy discussed.  He has no GI symptoms.  He will update me as needed.

## 2018-02-01 NOTE — Assessment & Plan Note (Signed)
Living will d/w pt.  Wife designated if patient were incapacitated.   ?

## 2018-03-25 ENCOUNTER — Inpatient Hospital Stay: Payer: BLUE CROSS/BLUE SHIELD | Attending: Internal Medicine

## 2018-03-25 ENCOUNTER — Inpatient Hospital Stay: Payer: BLUE CROSS/BLUE SHIELD

## 2018-03-25 LAB — CBC WITH DIFFERENTIAL/PLATELET
Abs Immature Granulocytes: 0.04 10*3/uL (ref 0.00–0.07)
Basophils Absolute: 0.1 10*3/uL (ref 0.0–0.1)
Basophils Relative: 1 %
EOS ABS: 0.1 10*3/uL (ref 0.0–0.5)
EOS PCT: 1 %
HCT: 46.2 % (ref 39.0–52.0)
Hemoglobin: 15.9 g/dL (ref 13.0–17.0)
Immature Granulocytes: 1 %
LYMPHS PCT: 27 %
Lymphs Abs: 2.3 10*3/uL (ref 0.7–4.0)
MCH: 31.8 pg (ref 26.0–34.0)
MCHC: 34.4 g/dL (ref 30.0–36.0)
MCV: 92.4 fL (ref 80.0–100.0)
MONO ABS: 0.8 10*3/uL (ref 0.1–1.0)
Monocytes Relative: 9 %
Neutro Abs: 5.2 10*3/uL (ref 1.7–7.7)
Neutrophils Relative %: 61 %
Platelets: 249 10*3/uL (ref 150–400)
RBC: 5 MIL/uL (ref 4.22–5.81)
RDW: 12.6 % (ref 11.5–15.5)
WBC: 8.5 10*3/uL (ref 4.0–10.5)
nRBC: 0 % (ref 0.0–0.2)

## 2018-03-25 LAB — IRON AND TIBC
Iron: 168 ug/dL (ref 45–182)
SATURATION RATIOS: 53 % — AB (ref 17.9–39.5)
TIBC: 316 ug/dL (ref 250–450)
UIBC: 148 ug/dL

## 2018-03-25 LAB — FERRITIN: FERRITIN: 159 ng/mL (ref 24–336)

## 2018-03-25 NOTE — Patient Instructions (Signed)

## 2018-06-17 DIAGNOSIS — J019 Acute sinusitis, unspecified: Secondary | ICD-10-CM | POA: Diagnosis not present

## 2018-09-18 ENCOUNTER — Other Ambulatory Visit: Payer: BLUE CROSS/BLUE SHIELD

## 2018-09-22 ENCOUNTER — Ambulatory Visit: Payer: BLUE CROSS/BLUE SHIELD | Admitting: Internal Medicine

## 2018-10-22 ENCOUNTER — Other Ambulatory Visit: Payer: BLUE CROSS/BLUE SHIELD

## 2018-10-23 ENCOUNTER — Other Ambulatory Visit: Payer: Self-pay

## 2018-10-26 ENCOUNTER — Other Ambulatory Visit: Payer: Self-pay

## 2018-10-26 ENCOUNTER — Inpatient Hospital Stay: Payer: BC Managed Care – PPO | Attending: Hematology and Oncology

## 2018-10-26 DIAGNOSIS — Z8042 Family history of malignant neoplasm of prostate: Secondary | ICD-10-CM

## 2018-10-26 LAB — COMPREHENSIVE METABOLIC PANEL
ALT: 44 U/L (ref 0–44)
AST: 33 U/L (ref 15–41)
Albumin: 4.2 g/dL (ref 3.5–5.0)
Alkaline Phosphatase: 66 U/L (ref 38–126)
Anion gap: 8 (ref 5–15)
BUN: 18 mg/dL (ref 6–20)
CO2: 23 mmol/L (ref 22–32)
Calcium: 9 mg/dL (ref 8.9–10.3)
Chloride: 104 mmol/L (ref 98–111)
Creatinine, Ser: 0.92 mg/dL (ref 0.61–1.24)
GFR calc Af Amer: >60 mL/min (ref >60)
GFR calc non Af Amer: >60 mL/min (ref >60)
Glucose, Bld: 119 mg/dL — ABNORMAL HIGH (ref 70–99)
Potassium: 4.1 mmol/L (ref 3.5–5.1)
Sodium: 135 mmol/L (ref 135–145)
Total Bilirubin: 0.9 mg/dL (ref 0.3–1.2)
Total Protein: 7.7 g/dL (ref 6.5–8.1)

## 2018-10-26 LAB — CBC WITH DIFFERENTIAL/PLATELET
Abs Immature Granulocytes: 0.04 10*3/uL (ref 0.00–0.07)
Basophils Absolute: 0.1 10*3/uL (ref 0.0–0.1)
Basophils Relative: 1 %
Eosinophils Absolute: 0.1 10*3/uL (ref 0.0–0.5)
Eosinophils Relative: 1 %
HCT: 44.6 % (ref 39.0–52.0)
Hemoglobin: 15.4 g/dL (ref 13.0–17.0)
Immature Granulocytes: 1 %
Lymphocytes Relative: 26 %
Lymphs Abs: 2.1 10*3/uL (ref 0.7–4.0)
MCH: 31.8 pg (ref 26.0–34.0)
MCHC: 34.5 g/dL (ref 30.0–36.0)
MCV: 92.1 fL (ref 80.0–100.0)
Monocytes Absolute: 0.7 10*3/uL (ref 0.1–1.0)
Monocytes Relative: 9 %
Neutro Abs: 5.1 10*3/uL (ref 1.7–7.7)
Neutrophils Relative %: 62 %
Platelets: 224 10*3/uL (ref 150–400)
RBC: 4.84 MIL/uL (ref 4.22–5.81)
RDW: 12.7 % (ref 11.5–15.5)
WBC: 8.1 10*3/uL (ref 4.0–10.5)
nRBC: 0 % (ref 0.0–0.2)

## 2018-10-26 LAB — IRON AND TIBC
Iron: 104 ug/dL (ref 45–182)
Saturation Ratios: 33 % (ref 17.9–39.5)
TIBC: 317 ug/dL (ref 250–450)
UIBC: 213 ug/dL

## 2018-10-26 LAB — FERRITIN: Ferritin: 149 ng/mL (ref 24–336)

## 2018-10-26 NOTE — Progress Notes (Signed)
[facility]  [address], [address] [place], [place] [address] Phone: [phone]  Fax: [phone]   Clinic Day:  10/29/2018  Referring physician: Tonia Ghent, MD  Chief Complaint: Ryan Calderon is a 48 y.o. male with hereditary hemochromatosis who is seen for new patient assessment.  HPI: The patient was initially diagnosed with hemochromatosis in 02/2015.  He presented with an elevated ferritin. He was asymptomatic. Hemochromatosis assay revealed a single mutation (H63D).  He denied any elevated ferritin levels on previous blood work.   He was initially seen in the hematology clinic on 03/14/2015 by Dr. Rogue Bussing. He denied any swelling, rash, or arthritis. He had no signs of cirrhosis. AST and ALT were slightly elevated at 29 and 51, respectively.  Therapeutic phlebotomies every 3 months was recommended to keep his ferritin under 200. He was initially seen every 6 months for two years and then annually. Notes reviewed.   Ferritin has been followed: 735 on 02/03/2015, 628 on 03/01/2015, 342 on 06/13/2015, 279 on 09/18/2015, 200 on 12/19/2015, 202 on 03/19/2016, 101 on 09/17/2016, 180 on 03/20/2017, 171 on 09/18/2017, 159 on 03/25/2018, 149 on 10/26/2018.   The patient was last seen in the medical oncology clinic on 09/23/2017 by Dr. Rogue Bussing. At that time, he was chronically fatigued.  He was snoring at night. He denied any swelling or headaches. He underwent therapeutic phlebotomy.  He underwent therapeutic phlebotomy on 03/14/2015, 06/16/2015, 09/19/2015, 12/26/2015, 04/03/2016, 09/23/2017 and 03/25/2018. He denies any issues with phlebotomies.   Labs on 10/26/2018 reviewed: WBC 8,100, hemoglobin 15.4, hematocrit 44.6, platelets 224,000.  Ferritin was 149.  Iron saturation was 33%.  During the interim, he is doing well and denies any concerns. He denies any fatigue or joint pain. He denies any shortness of breath or cough. He notes occasional RUQ  abdominal cramps and tightness.   When his ferritin is high, he notes joint pain in his hips. His legs feel heavy and he is fatigued.    He has never had a liver US, but he is agreeable to undergo one annually.   He has altered his diet to include less fish and red meat. He has stopped eating oranges and drinking orange juice.   He denies any known family history of any blood disorders. He has two sons and asked if they should be tested for hemochromatosis.    Past Medical History:  Diagnosis Date  . BCC (basal cell carcinoma of skin)   . Hereditary hemochromatosis   . Hyperlipidemia   . Pneumothorax    Complicated course, Dr. Delilah Shan followed    History reviewed. No pertinent surgical history.  Family History  Problem Relation Age of Onset  . Prostate cancer Father 46  . COPD Father        smoker  . Diabetes Unknown   . Liver disease Brother     Social History:  reports that he has never smoked. His smokeless tobacco use includes snuff. He reports current alcohol use. He reports that he does not use drugs. He does not smoke. He drinks 3 beers a week. He denies any known exposure to radiation or toxins. He has two sons, 14yo and 37yo. 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  Allergies:  Allergies  Allergen Reactions  . Peanut-Containing Drug Products Other (See Comments)    Patient allergic to peanuts - states it make him unable to talk above a whisper.  Current Medications: No current outpatient medications on file.   No current facility-administered medications for this visit.     Review of Systems  Constitutional: Negative for chills, diaphoresis, fever, malaise/fatigue and weight loss.       Doing well.   HENT: Negative.  Negative for congestion, hearing loss, nosebleeds, sinus pain and sore throat.   Eyes: Negative.  Negative for blurred vision, double vision and photophobia.  Respiratory:  Negative.  Negative for cough, shortness of breath and wheezing.   Cardiovascular: Negative.  Negative for chest pain, palpitations, claudication, leg swelling and PND.  Gastrointestinal: Positive for abdominal pain (occasional RUQ cramps). Negative for blood in stool, constipation, diarrhea, melena, nausea and vomiting.  Genitourinary: Negative.  Negative for dysuria, frequency, hematuria and urgency.  Musculoskeletal: Negative.  Negative for back pain, joint pain and myalgias.  Skin: Negative.  Negative for rash.  Neurological: Negative.  Negative for dizziness, tingling, sensory change, weakness and headaches.  Endo/Heme/Allergies: Negative.  Does not bruise/bleed easily.  Psychiatric/Behavioral: Negative.  Negative for depression and memory loss. The patient is not nervous/anxious and does not have insomnia.   All other systems reviewed and are negative.  Performance status (ECOG): 0  Physical Exam  Constitutional: He is oriented to person, place, and time. He appears well-developed and well-nourished. No distress.  HENT:  Head: Normocephalic and atraumatic.  Mouth/Throat: Oropharynx is clear and moist. No oropharyngeal exudate.  Dark graying hair. Mask.  Eyes: Pupils are equal, round, and reactive to light. Conjunctivae and EOM are normal. No scleral icterus.  Neck: Normal range of motion. Neck supple. No JVD present.  Cardiovascular: Normal rate, regular rhythm and normal heart sounds.  No murmur heard. Pulmonary/Chest: Effort normal and breath sounds normal. No respiratory distress. He has no wheezes. He has no rales.  Abdominal: Soft. Bowel sounds are normal. He exhibits no distension and no mass. There is no splenomegaly or hepatomegaly. There is no abdominal tenderness. There is no rebound and no guarding.  Musculoskeletal: Normal range of motion.        General: No edema.  Lymphadenopathy:    He has no cervical adenopathy.    He has no axillary adenopathy.       Right: No  supraclavicular adenopathy present.       Left: No supraclavicular adenopathy present.  Neurological: He is alert and oriented to person, place, and time.  Skin: Skin is warm and dry. He is not diaphoretic. No erythema.  Psychiatric: He has a normal mood and affect. His behavior is normal. Judgment and thought content normal.  Nursing note and vitals reviewed.   No visits with results within 3 Day(s) from this visit.  Latest known visit with results is:  Appointment on 10/26/2018  Component Date Value Ref Range Status  . Sodium 10/26/2018 135  135 - 145 mmol/L Final  . Potassium 10/26/2018 4.1  3.5 - 5.1 mmol/L Final  . Chloride 10/26/2018 104  98 - 111 mmol/L Final  . CO2 10/26/2018 23  22 - 32 mmol/L Final  . Glucose, Bld 10/26/2018 119* 70 - 99 mg/dL Final  . BUN 10/26/2018 18  6 - 20 mg/dL Final  . Creatinine, Ser 10/26/2018 0.92  0.61 - 1.24 mg/dL Final  . Calcium 10/26/2018 9.0  8.9 - 10.3 mg/dL Final  . Total Protein 10/26/2018 7.7  6.5 - 8.1 g/dL Final  . Albumin 10/26/2018 4.2  3.5 - 5.0 g/dL Final  . AST 10/26/2018 33  15 - 41 U/L Final  .  ALT 10/26/2018 44  0 - 44 U/L Final  . Alkaline Phosphatase 10/26/2018 66  38 - 126 U/L Final  . Total Bilirubin 10/26/2018 0.9  0.3 - 1.2 mg/dL Final  . GFR calc non Af Amer 10/26/2018 >60  >60 mL/min Final  . GFR calc Af Amer 10/26/2018 >60  >60 mL/min Final  . Anion gap 10/26/2018 8  5 - 15 Final   Performed at [facility], [address], [place], [place] [address]  . Ferritin 10/26/2018 149  24 - 336 ng/mL Final   Performed at [facility], [address] [place], [place] [address]  . Iron 10/26/2018 104  45 - 182 ug/dL Final  . TIBC 10/26/2018 317  250 - 450 ug/dL Final  . Saturation Ratios 10/26/2018 33  17.9 - 39.5 % Final  . UIBC 10/26/2018 213  ug/dL Final   Performed at [facility], [address] [place], [place] [address]  . WBC 10/26/2018 8.1  4.0 - 10.5 K/uL Final  . RBC 10/26/2018  4.84  4.22 - 5.81 MIL/uL Final  . Hemoglobin 10/26/2018 15.4  13.0 - 17.0 g/dL Final  . HCT 10/26/2018 44.6  39.0 - 52.0 % Final  . MCV 10/26/2018 92.1  80.0 - 100.0 fL Final  . MCH 10/26/2018 31.8  26.0 - 34.0 pg Final  . MCHC 10/26/2018 34.5  30.0 - 36.0 g/dL Final  . RDW 10/26/2018 12.7  11.5 - 15.5 % Final  . Platelets 10/26/2018 224  150 - 400 K/uL Final  . nRBC 10/26/2018 0.0  0.0 - 0.2 % Final  . Neutrophils Relative % 10/26/2018 62  % Final  . Neutro Abs 10/26/2018 5.1  1.7 - 7.7 K/uL Final  . Lymphocytes Relative 10/26/2018 26  % Final  . Lymphs Abs 10/26/2018 2.1  0.7 - 4.0 K/uL Final  . Monocytes Relative 10/26/2018 9  % Final  . Monocytes Absolute 10/26/2018 0.7  0.1 - 1.0 K/uL Final  . Eosinophils Relative 10/26/2018 1  % Final  . Eosinophils Absolute 10/26/2018 0.1  0.0 - 0.5 K/uL Final  . Basophils Relative 10/26/2018 1  % Final  . Basophils Absolute 10/26/2018 0.1  0.0 - 0.1 K/uL Final  . Immature Granulocytes 10/26/2018 1  % Final  . Abs Immature Granulocytes 10/26/2018 0.04  0.00 - 0.07 K/uL Final   Performed at [facility], [address], [place], [place] [address]    Assessment:  Ryan Calderon is a 48 y.o. male with hereditary hemochromatosis diagnosed in 02/2015.  He has a single mutation (H63D).    Ferritin has been followed: 735 on 02/03/2015, 628 on 03/01/2015, 342 on 06/13/2015, 279 on 09/18/2015, 200 on 12/19/2015, 202 on 03/19/2016, 101 on 09/17/2016, 180 on 03/20/2017, 171 on 09/18/2017, 159 on 03/25/2018, 149 on 10/26/2018.   He has undergone therapeutic phlebotomy on 03/14/2015, 06/16/2015, 09/19/2015, 12/26/2015, 04/03/2016, 09/23/2017 and 03/25/2018.  Ferritin goal is < 200.  Symptomatically, he feels fine.  He denies any arthritis, skin changes, or known liver disease.  Exam is unremarkable.  Plan: 1.   Review labs from 10/26/2018 (CBC w/ diff, CMP, iron studies, ferritin). 2.   Hereditary hemochromatosis  Review entire medical  history, diagnosis and management of hemochromatosis.      Discuss typically single mutation carriers do not have iron overload unless another mutation (not tested) is present.  Discuss surveillance for hepatocellular carcinoma with AFP and liver imaging every 6 months.  Discuss checking hepatitis serologies  and consideration of hepatitis B vaccination.  No phlebotomy today. 3.   RUQ prior to next visit. 4.   RTC in 6 months for MD assessment, labs (CBC with diff, ferritin, iron saturation, CMP, AFP, hepatitis B surface antigen, hepatitis B core antibody total, hepatitis C antibody).  I discussed the assessment and treatment plan with the patient.  The patient was provided an opportunity to ask questions and all were answered.  The patient agreed with the plan and demonstrated an understanding of the instructions.  The patient was advised to call back if the symptoms worsen or if the condition fails to improve as anticipated.   Lequita Asal, MD, PhD    10/29/2018, 8:46 AM  I, Cloyde Reams Dorshimer, am acting as Education administrator for [employer]. Mike Gip, MD, PhD.  I, Melissa C. Mike Gip, MD, have reviewed the above documentation for accuracy and completeness, and I agree with the above.

## 2018-10-28 ENCOUNTER — Other Ambulatory Visit: Payer: Self-pay

## 2018-10-29 ENCOUNTER — Inpatient Hospital Stay: Payer: BC Managed Care – PPO

## 2018-10-29 ENCOUNTER — Encounter: Payer: Self-pay | Admitting: Hematology and Oncology

## 2018-10-29 ENCOUNTER — Inpatient Hospital Stay (HOSPITAL_BASED_OUTPATIENT_CLINIC_OR_DEPARTMENT_OTHER): Payer: BC Managed Care – PPO | Admitting: Hematology and Oncology

## 2018-10-29 NOTE — Progress Notes (Signed)
No new changes noted today 

## 2018-11-24 DIAGNOSIS — L918 Other hypertrophic disorders of the skin: Secondary | ICD-10-CM | POA: Diagnosis not present

## 2018-11-24 DIAGNOSIS — Z1283 Encounter for screening for malignant neoplasm of skin: Secondary | ICD-10-CM | POA: Diagnosis not present

## 2018-11-24 DIAGNOSIS — L821 Other seborrheic keratosis: Secondary | ICD-10-CM | POA: Diagnosis not present

## 2019-02-01 ENCOUNTER — Other Ambulatory Visit: Payer: Self-pay

## 2019-02-01 ENCOUNTER — Ambulatory Visit (INDEPENDENT_AMBULATORY_CARE_PROVIDER_SITE_OTHER): Payer: BC Managed Care – PPO | Admitting: Family Medicine

## 2019-02-01 ENCOUNTER — Encounter: Payer: Self-pay | Admitting: Family Medicine

## 2019-02-01 VITALS — BP 122/78 | HR 93 | Temp 97.6°F | Ht 73.0 in | Wt 280.1 lb

## 2019-02-01 DIAGNOSIS — Z0000 Encounter for general adult medical examination without abnormal findings: Secondary | ICD-10-CM

## 2019-02-01 DIAGNOSIS — Z7189 Other specified counseling: Secondary | ICD-10-CM

## 2019-02-01 NOTE — Patient Instructions (Signed)
Update me as needed.  I'll await your labs.  Take care.  Glad to see you.

## 2019-02-01 NOTE — Progress Notes (Signed)
CPE- See plan.  Routine anticipatory guidance given to patient.  See health maintenance.  The possibility exists that previously documented standard health maintenance information may have been brought forward from a previous encounter into this note.  If needed, that same information has been updated to reflect the current situation based on today's encounter.    He thought he had a tetanus shot around 2015 with a foot injury.  PNA and shingles not due.  Flu shot to be done next week per patient report. Colon and prostate cancer screening not due.  D/w pt.    Living will d/w pt. Wife designated if patient were incapacitated.  [facility]. Diet and exercise d/w pt.   He is cutting back on dip, d/w pt.   We talked about pandemic considerations.  He had an illness in 06/2018, unclear if that was covid.  No symptoms at this point.  He is going to get blood work done next week and he'll send me a copy.    He has hematology f/u pending.  D/w pt.   I will defer.  He agrees.  PMH and SH reviewed  Meds, vitals, and allergies reviewed.   ROS: Per HPI.  Unless specifically indicated otherwise in HPI, the patient denies:  General: fever. Eyes: acute vision changes ENT: sore throat Cardiovascular: chest pain Respiratory: SOB GI: vomiting GU: dysuria  Musculoskeletal: acute back pain Derm: acute rash Neuro: acute motor dysfunction Psych: worsening mood Endocrine: polydipsia Heme: bleeding Allergy: hayfever  GEN: nad, alert and oriented HEENT: ncat NECK: supple w/o LA CV: rrr. PULM: ctab, no inc wob ABD: soft, +bs EXT: no edema SKIN: no acute rash

## 2019-02-04 NOTE — Assessment & Plan Note (Signed)
He thought he had a tetanus shot around 2015 with a foot injury.  PNA and shingles not due.  Flu shot to be done next week per patient report. Colon and prostate cancer screening not due.  D/w pt.    Living will d/w pt. Wife designated if patient were incapacitated.  [facility]. Diet and exercise d/w pt.   He is cutting back on dip, d/w pt.   We talked about pandemic considerations.  He had an illness in 06/2018, unclear if that was covid.  No symptoms at this point. He is going to get blood work done next week and he'll send me a copy.

## 2019-02-04 NOTE — Assessment & Plan Note (Signed)
Living will d/w pt.  Wife designated if patient were incapacitated.   ?

## 2019-02-04 NOTE — Assessment & Plan Note (Signed)
He has hematology f/u pending.  D/w pt.   I will defer.  He agrees.

## 2019-04-16 ENCOUNTER — Ambulatory Visit: Payer: BC Managed Care – PPO

## 2019-04-23 ENCOUNTER — Inpatient Hospital Stay: Payer: BC Managed Care – PPO

## 2019-04-23 ENCOUNTER — Other Ambulatory Visit: Payer: Self-pay

## 2019-04-23 ENCOUNTER — Inpatient Hospital Stay: Payer: BC Managed Care – PPO | Attending: Oncology | Admitting: Oncology

## 2019-04-23 DIAGNOSIS — R7989 Other specified abnormal findings of blood chemistry: Secondary | ICD-10-CM | POA: Diagnosis not present

## 2019-04-23 LAB — CBC WITH DIFFERENTIAL/PLATELET
Abs Immature Granulocytes: 0.03 10*3/uL (ref 0.00–0.07)
Basophils Absolute: 0.1 10*3/uL (ref 0.0–0.1)
Basophils Relative: 1 %
Eosinophils Absolute: 0.2 10*3/uL (ref 0.0–0.5)
Eosinophils Relative: 2 %
HCT: 46 % (ref 39.0–52.0)
Hemoglobin: 15.9 g/dL (ref 13.0–17.0)
Immature Granulocytes: 0 %
Lymphocytes Relative: 30 %
Lymphs Abs: 2.8 10*3/uL (ref 0.7–4.0)
MCH: 31.5 pg (ref 26.0–34.0)
MCHC: 34.6 g/dL (ref 30.0–36.0)
MCV: 91.1 fL (ref 80.0–100.0)
Monocytes Absolute: 0.8 10*3/uL (ref 0.1–1.0)
Monocytes Relative: 9 %
Neutro Abs: 5.5 10*3/uL (ref 1.7–7.7)
Neutrophils Relative %: 58 %
Platelets: 248 10*3/uL (ref 150–400)
RBC: 5.05 MIL/uL (ref 4.22–5.81)
RDW: 12.4 % (ref 11.5–15.5)
WBC: 9.3 10*3/uL (ref 4.0–10.5)
nRBC: 0 % (ref 0.0–0.2)

## 2019-04-23 LAB — IRON AND TIBC
Iron: 86 ug/dL (ref 45–182)
Saturation Ratios: 28 % (ref 17.9–39.5)
TIBC: 311 ug/dL (ref 250–450)
UIBC: 225 ug/dL

## 2019-04-23 LAB — FERRITIN: Ferritin: 177 ng/mL (ref 24–336)

## 2019-04-23 NOTE — Progress Notes (Signed)
Patient here for follow up. Denies any concerns. Reports he was scheduled for abdominal US but was informed it needed to be rescheduled.

## 2019-04-23 NOTE — Progress Notes (Signed)
[facility]  [address], [address] [place], [place] [address] Phone: [phone]  Fax: [phone]   Clinic Day:  04/23/2019  Referring physician: Tonia Ghent, MD  Chief Complaint: Ryan Calderon is a 48 y.o. male with hereditary hemochromatosis who is seen for new patient assessment.  HPI: The patient was initially diagnosed with hemochromatosis in 02/2015.  He presented with an elevated ferritin. He was asymptomatic. Hemochromatosis assay revealed a single mutation (H63D).  He denied any elevated ferritin levels on previous blood work.   He was initially seen in the hematology clinic on 03/14/2015 by Dr. Rogue Bussing. He denied any swelling, rash, or arthritis. He had no signs of cirrhosis. AST and ALT were slightly elevated at 29 and 51, respectively.  Therapeutic phlebotomies every 3 months was recommended to keep his ferritin under 200. He was initially seen every 6 months for two years and then annually. Notes reviewed.   Ferritin has been followed: 735 on 02/03/2015, 628 on 03/01/2015, 342 on 06/13/2015, 279 on 09/18/2015, 200 on 12/19/2015, 202 on 03/19/2016, 101 on 09/17/2016, 180 on 03/20/2017, 171 on 09/18/2017, 159 on 03/25/2018, 149 on 10/26/2018.   The patient was last seen in the medical oncology clinic on 10/29/2018.  At that time, he was feeling well.  He denied any new concerns.  He underwent therapeutic phlebotomy on 03/14/2015, 06/16/2015, 09/19/2015, 12/26/2015, 04/03/2016, 09/23/2017 and 03/25/2018. He denies any issues with phlebotomies.   Labs on 10/26/2018 reviewed: WBC 8,100, hemoglobin 15.4, hematocrit 44.6, platelets 224,000.  Ferritin was 149.  Iron saturation was 33%.  During the interim, he denies any new concerns.  He denies any fatigue or joint pain.  He denies any shortness of breath or cough.  He denies any abdominal pain.  He did not have his right upper quadrant abdominal ultrasound due to confusion with his appointment.  He  is willing to have this rescheduled.  He continues to not eat fast food and his diet consist of less fish and red meat.  He has slowly incorporated oranges and orange juice back in his diet.   He denies any known family history of any blood disorders. He has two sons and asked if they should be tested for hemochromatosis.    Past Medical History:  Diagnosis Date  . BCC (basal cell carcinoma of skin)   . Hereditary hemochromatosis   . Hyperlipidemia   . Pneumothorax    Complicated course, Dr. Delilah Shan followed    No past surgical history on file.  Family History  Problem Relation Age of Onset  . Prostate cancer Father 50  . COPD Father        smoker  . Diabetes Other   . Liver disease Brother   . Colon cancer Neg Hx     Social History:  reports that he has never smoked. His smokeless tobacco use includes snuff. He reports current alcohol use. He reports that he does not use drugs. He does not smoke. He drinks 3 beers a week. He denies any known exposure to radiation or toxins. He has two sons, 44yo and 70yo. 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  Allergies:  Allergies  Allergen Reactions  . Peanut-Containing Drug Products Other (See Comments)    Patient allergic to peanuts - states it make him unable to talk above a whisper.    Current Medications: No current outpatient medications on file.   No current facility-administered medications  for this visit.    Review of Systems  Constitutional: Negative.  Negative for chills, fever, malaise/fatigue and weight loss.  HENT: Negative for congestion, ear pain and tinnitus.   Eyes: Negative.  Negative for blurred vision and double vision.  Respiratory: Negative.  Negative for cough, sputum production and shortness of breath.   Cardiovascular: Negative.  Negative for chest pain, palpitations and leg swelling.  Gastrointestinal: Negative.  Negative for abdominal  pain, constipation, diarrhea, nausea and vomiting.  Genitourinary: Negative for dysuria, frequency and urgency.  Musculoskeletal: Negative for back pain and falls.  Skin: Negative.  Negative for rash.  Neurological: Negative.  Negative for weakness and headaches.  Endo/Heme/Allergies: Negative.  Does not bruise/bleed easily.  Psychiatric/Behavioral: Negative.  Negative for depression. The patient is not nervous/anxious and does not have insomnia.    Performance status (ECOG): 0  Physical Exam  Constitutional: He is oriented to person, place, and time. Vital signs are normal. He appears well-developed and well-nourished.  HENT:  Head: Normocephalic and atraumatic.  Eyes: Pupils are equal, round, and reactive to light.  Cardiovascular: Normal rate and regular rhythm.  No murmur heard. Pulmonary/Chest: Effort normal and breath sounds normal. He has no wheezes.  Abdominal: Soft. Bowel sounds are normal. He exhibits no distension and no mass. There is no abdominal tenderness.  Musculoskeletal:        General: No edema. Normal range of motion.     Cervical back: Normal range of motion.  Neurological: He is alert and oriented to person, place, and time.  Skin: Skin is warm and dry.  Psychiatric: He has a normal mood and affect. His behavior is normal.    No visits with results within 3 Day(s) from this visit.  Latest known visit with results is:  Appointment on 10/26/2018  Component Date Value Ref Range Status  . Sodium 10/26/2018 135  135 - 145 mmol/L Final  . Potassium 10/26/2018 4.1  3.5 - 5.1 mmol/L Final  . Chloride 10/26/2018 104  98 - 111 mmol/L Final  . CO2 10/26/2018 23  22 - 32 mmol/L Final  . Glucose, Bld 10/26/2018 119* 70 - 99 mg/dL Final  . BUN 10/26/2018 18  6 - 20 mg/dL Final  . Creatinine, Ser 10/26/2018 0.92  0.61 - 1.24 mg/dL Final  . Calcium 10/26/2018 9.0  8.9 - 10.3 mg/dL Final  . Total Protein 10/26/2018 7.7  6.5 - 8.1 g/dL Final  . Albumin 10/26/2018 4.2  3.5  - 5.0 g/dL Final  . AST 10/26/2018 33  15 - 41 U/L Final  . ALT 10/26/2018 44  0 - 44 U/L Final  . Alkaline Phosphatase 10/26/2018 66  38 - 126 U/L Final  . Total Bilirubin 10/26/2018 0.9  0.3 - 1.2 mg/dL Final  . GFR calc non Af Amer 10/26/2018 >60  >60 mL/min Final  . GFR calc Af Amer 10/26/2018 >60  >60 mL/min Final  . Anion gap 10/26/2018 8  5 - 15 Final   Performed at [facility], [address], [place], [place] [address]  . Ferritin 10/26/2018 149  24 - 336 ng/mL Final   Performed at [facility], [address] [place], [place] [address]  . Iron 10/26/2018 104  45 - 182 ug/dL Final  . TIBC 10/26/2018 317  250 - 450 ug/dL Final  . Saturation Ratios 10/26/2018 33  17.9 - 39.5 % Final  . UIBC 10/26/2018 213  ug/dL Final   Performed at [facility], [address], [place], [place] [address]  . WBC 10/26/2018 8.1  4.0 - 10.5 K/uL Final  . RBC 10/26/2018 4.84  4.22 - 5.81 MIL/uL Final  . Hemoglobin 10/26/2018 15.4  13.0 - 17.0 g/dL Final  . HCT 10/26/2018 44.6  39.0 - 52.0 % Final  . MCV 10/26/2018 92.1  80.0 - 100.0 fL Final  . MCH 10/26/2018 31.8  26.0 - 34.0 pg Final  . MCHC 10/26/2018 34.5  30.0 - 36.0 g/dL Final  . RDW 10/26/2018 12.7  11.5 - 15.5 % Final  . Platelets 10/26/2018 224  150 - 400 K/uL Final  . nRBC 10/26/2018 0.0  0.0 - 0.2 % Final  . Neutrophils Relative % 10/26/2018 62  % Final  . Neutro Abs 10/26/2018 5.1  1.7 - 7.7 K/uL Final  . Lymphocytes Relative 10/26/2018 26  % Final  . Lymphs Abs 10/26/2018 2.1  0.7 - 4.0 K/uL Final  . Monocytes Relative 10/26/2018 9  % Final  . Monocytes Absolute 10/26/2018 0.7  0.1 - 1.0 K/uL Final  . Eosinophils Relative 10/26/2018 1  % Final  . Eosinophils Absolute 10/26/2018 0.1  0.0 - 0.5 K/uL Final  . Basophils Relative 10/26/2018 1  % Final  . Basophils Absolute 10/26/2018 0.1  0.0 - 0.1 K/uL Final  . Immature Granulocytes 10/26/2018 1  % Final  . Abs Immature Granulocytes 10/26/2018 0.04   0.00 - 0.07 K/uL Final   Performed at [facility], [address], [place],  [address]    Assessment:  Ryan Calderon is a 48 y.o. male with hereditary hemochromatosis diagnosed in 02/2015.  He has a single mutation (H63D).    Ferritin has been followed: 735 on 02/03/2015, 628 on 03/01/2015, 342 on 06/13/2015, 279 on 09/18/2015, 200 on 12/19/2015, 202 on 03/19/2016, 101 on 09/17/2016, 180 on 03/20/2017, 171 on 09/18/2017, 159 on 03/25/2018, 149 on 10/26/2018.   He has undergone therapeutic phlebotomy on 03/14/2015, 06/16/2015, 09/19/2015, 12/26/2015, 04/03/2016, 09/23/2017 and 03/25/2018.  Ferritin goal is < 200.  Symptomatically, he feels fine.  He denies any concerns at this time.  Exam is unremarkable.  Plan: 1.   Review labs from 04/23/19 (CBC w/ diff, CMP, iron studies, ferritin, Hep B antibody, Hep B surface antigen, Hep C antibody) 2.   Hereditary hemochromatosis  Reschedule right upper quadrant ultrasound.  No phlebotomy today. 3.   RTC in 6 months for MD assessment, labs (CBC with diff, ferritin, iron saturation, CMP, AFP)  I discussed the assessment and treatment plan with the patient.  The patient was provided an opportunity to ask questions and all were answered.  The patient agreed with the plan and demonstrated an understanding of the instructions.  The patient was advised to call back if the symptoms worsen or if the condition fails to improve as anticipated.  Greater than 50% was spent in counseling and coordination of care with this patient including but not limited to discussion of the relevant topics above (See A&P) including, but not limited to diagnosis and management of acute and chronic medical conditions.   Faythe Casa, NP 04/23/2019 11:27 AM

## 2019-04-24 LAB — HEPATITIS B SURFACE ANTIGEN: Hepatitis B Surface Ag: NONREACTIVE

## 2019-04-24 LAB — HEPATITIS C ANTIBODY: HCV Ab: NONREACTIVE

## 2019-04-24 LAB — HEPATITIS B CORE ANTIBODY, TOTAL: Hep B Core Total Ab: NONREACTIVE

## 2019-04-28 ENCOUNTER — Ambulatory Visit: Payer: BC Managed Care – PPO

## 2019-05-04 ENCOUNTER — Ambulatory Visit: Payer: BC Managed Care – PPO

## 2019-10-19 NOTE — Progress Notes (Signed)
[facility]  [address] [place],  [address] Phone: [phone]  Fax: [phone]   Clinic Day:  10/21/2019  Referring physician: Tonia Ghent, MD  Chief Complaint: Ryan Calderon is a 49 y.o. male with hereditary hemochromatosis (single mutation H63D) who is seen for a 6 month assessment.  HPI: The patient was last seen in the hematology clinic on 10/29/2018. At that time, he felt fine. He denied any arthritis, skin changes, or known liver disease. Exam was unremarkable. He did not have a phlebotomy.   The patient was seen by Faythe Casa, NP on 04/23/2019. He had no new concerns or symptoms. Exam was normal. CBC was normal. Ferritin was 177 with an iron saturation of 28% and TIBC of 311. Hepatitis B surface antigen, hepatitis B core antibody total, and hepatitis C antibody were negative. He did not have a phlebotomy. He had not had his RUQ ultrasound due to confusion with his appointment.    During the interim, he has been good. He missed his RUQ ultrasound appointment due to COVID-19, but he will go if it is rescheduled. The patient denies any symptoms or complaints at this time. His RUQ pain has resolved. He denies any B symptoms. He is not taking any cholesterol medication at this time. He has not received his COVID-19 vaccine at this time.    Past Medical History:  Diagnosis Date  . BCC (basal cell carcinoma of skin)   . Hereditary hemochromatosis   . Hyperlipidemia   . Pneumothorax    Complicated course, Dr. Delilah Shan followed    History reviewed. No pertinent surgical history.  Family History  Problem Relation Age of Onset  . Prostate cancer Father 67  . COPD Father        smoker  . Diabetes Other   . Liver disease Brother   . Colon cancer Neg Hx     Social History:  reports that he has never smoked. His smokeless tobacco use includes snuff. He reports current alcohol use. He reports that he does not use drugs. He does not  smoke. He drinks 3 beers a week. He denies any known exposure to radiation or toxins. He has two sons, 41yo and 24yo. 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  Allergies:  Allergies  Allergen Reactions  . Peanut-Containing Drug Products Other (See Comments)    Patient allergic to peanuts - states it make him unable to talk above a whisper.    Current Medications: Current Outpatient Medications  Medication Sig Dispense Refill  . CALCIUM-MAGNESIUM-VITAMIN D PO Take by mouth.    . cholecalciferol (VITAMIN D3) 25 MCG (1000 UNIT) tablet Take 1,000 Units by mouth daily.    . DHA-EPA-Vitamin E (OMEGA-3 COMPLEX PO) Take by mouth.    . DHA-EPA-VITAMIN E PO Take by mouth.    . Multiple Vitamin (MULTIVITAMIN) tablet Take 1 tablet by mouth daily.     No current facility-administered medications for this visit.    Review of Systems  Constitutional: Positive for weight loss (4 lbs). Negative for chills, diaphoresis, fever and malaise/fatigue.       Doing well.   HENT: Negative.  Negative for congestion, hearing loss, nosebleeds, sinus pain and sore throat.   Eyes: Negative.  Negative for blurred vision, double vision and photophobia.  Respiratory: Negative.  Negative for cough, shortness of breath and wheezing.   Cardiovascular: Negative.  Negative for chest pain, palpitations,  claudication, leg swelling and PND.  Gastrointestinal: Negative for abdominal pain, blood in stool, constipation, diarrhea, melena, nausea and vomiting.  Genitourinary: Negative.  Negative for dysuria, frequency, hematuria and urgency.  Musculoskeletal: Negative.  Negative for back pain, joint pain and myalgias.  Skin: Negative.  Negative for rash.  Neurological: Negative.  Negative for dizziness, tingling, sensory change, weakness and headaches.  Endo/Heme/Allergies: Negative.  Does not bruise/bleed easily.  Psychiatric/Behavioral: Negative.  Negative  for depression and memory loss. The patient is not nervous/anxious and does not have insomnia.   All other systems reviewed and are negative.  Performance status (ECOG): 0  Vital Signs: Vitals:   10/21/19 0845  BP: 134/90  Pulse: 72  Temp: (!) 97 F (36.1 C)  SpO2: 98%    Physical Exam Vitals and nursing note reviewed.  Constitutional:      General: He is not in acute distress.    Appearance: He is well-developed. He is not diaphoretic.     Interventions: Face mask in place.  HENT:     Head: Normocephalic and atraumatic.     Comments: Dark gray hair, beard    Mouth/Throat:     Pharynx: No oropharyngeal exudate.  Eyes:     General: No scleral icterus.    Conjunctiva/sclera: Conjunctivae normal.     Pupils: Pupils are equal, round, and reactive to light.  Neck:     Vascular: No JVD.  Cardiovascular:     Rate and Rhythm: Normal rate and regular rhythm.     Heart sounds: Normal heart sounds. No murmur heard.   Pulmonary:     Effort: Pulmonary effort is normal. No respiratory distress.     Breath sounds: Normal breath sounds. No wheezing or rales.  Abdominal:     General: Bowel sounds are normal. There is no distension.     Palpations: Abdomen is soft. There is no hepatomegaly, splenomegaly or mass.     Tenderness: There is no abdominal tenderness. There is no guarding or rebound.  Musculoskeletal:        General: Normal range of motion.     Cervical back: Normal range of motion and neck supple.  Lymphadenopathy:     Cervical: No cervical adenopathy.     Upper Body:     Right upper body: No supraclavicular adenopathy.     Left upper body: No supraclavicular adenopathy.  Skin:    General: Skin is warm and dry.     Findings: No erythema.  Neurological:     Mental Status: He is alert and oriented to person, place, and time.  Psychiatric:        Behavior: Behavior normal.        Thought Content: Thought content normal.        Judgment: Judgment normal.      Appointment on 10/21/2019  Component Date Value Ref Range Status  . Sodium 10/21/2019 138  135 - 145 mmol/L Final  . Potassium 10/21/2019 4.5  3.5 - 5.1 mmol/L Final  . Chloride 10/21/2019 103  98 - 111 mmol/L Final  . CO2 10/21/2019 27  22 - 32 mmol/L Final  . Glucose, Bld 10/21/2019 113* 70 - 99 mg/dL Final   Glucose reference range applies only to samples taken after fasting for at least 8 hours.  . BUN 10/21/2019 20  6 - 20 mg/dL Final  . Creatinine, Ser 10/21/2019 1.01  0.61 - 1.24 mg/dL Final  . Calcium 10/21/2019 9.1  8.9 - 10.3 mg/dL Final  . Total  Protein 10/21/2019 7.6  6.5 - 8.1 g/dL Final  . Albumin 10/21/2019 4.3  3.5 - 5.0 g/dL Final  . AST 10/21/2019 32  15 - 41 U/L Final  . ALT 10/21/2019 57* 0 - 44 U/L Final  . Alkaline Phosphatase 10/21/2019 60  38 - 126 U/L Final  . Total Bilirubin 10/21/2019 1.0  0.3 - 1.2 mg/dL Final  . GFR calc non Af Amer 10/21/2019 >60  >60 mL/min Final  . GFR calc Af Amer 10/21/2019 >60  >60 mL/min Final  . Anion gap 10/21/2019 8  5 - 15 Final   Performed at [facility], [address] [place], [place] [address]  . WBC 10/21/2019 8.1  4.0 - 10.5 K/uL Final  . RBC 10/21/2019 5.01  4.22 - 5.81 MIL/uL Final  . Hemoglobin 10/21/2019 15.7  13.0 - 17.0 g/dL Final  . HCT 10/21/2019 46.1  39 - 52 % Final  . MCV 10/21/2019 92.0  80.0 - 100.0 fL Final  . MCH 10/21/2019 31.3  26.0 - 34.0 pg Final  . MCHC 10/21/2019 34.1  30.0 - 36.0 g/dL Final  . RDW 10/21/2019 12.7  11.5 - 15.5 % Final  . Platelets 10/21/2019 251  150 - 400 K/uL Final  . nRBC 10/21/2019 0.0  0.0 - 0.2 % Final  . Neutrophils Relative % 10/21/2019 56  % Final  . Neutro Abs 10/21/2019 4.7  1.7 - 7.7 K/uL Final  . Lymphocytes Relative 10/21/2019 30  % Final  . Lymphs Abs 10/21/2019 2.4  0.7 - 4.0 K/uL Final  . Monocytes Relative 10/21/2019 11  % Final  . Monocytes Absolute 10/21/2019 0.9  0 - 1 K/uL Final  . Eosinophils Relative 10/21/2019 1  % Final  .  Eosinophils Absolute 10/21/2019 0.1  0 - 0 K/uL Final  . Basophils Relative 10/21/2019 1  % Final  . Basophils Absolute 10/21/2019 0.1  0 - 0 K/uL Final  . Immature Granulocytes 10/21/2019 1  % Final  . Abs Immature Granulocytes 10/21/2019 0.04  0.00 - 0.07 K/uL Final   Performed at [facility], [address] [place], [place] [address]    Assessment:  Ryan Calderon is a 49 y.o. male with hereditary hemochromatosis diagnosed in 02/2015.  He has a single mutation (H63D).    Ferritin has been followed: 735 on 02/03/2015, 628 on 03/01/2015, 342 on 06/13/2015, 279 on 09/18/2015, 200 on 12/19/2015, 202 on 03/19/2016, 101 on 09/17/2016, 180 on 03/20/2017, 171 on 09/18/2017, 159 on 03/25/2018, 149 on 10/26/2018, and 198 on 10/21/2019.   He has undergone therapeutic phlebotomy on 03/14/2015, 06/16/2015, 09/19/2015, 12/26/2015, 04/03/2016, 09/23/2017 and 03/25/2018.  Ferritin goal is < 200.  Hepatitis B surface antigen, hepatitis B core antibody total, and hepatitis C antibody were negative on 04/23/2019.   AFP has been followed: 1.5 on 10/21/2019.  He has not received his COVID-19 vaccine at this time.   Symptomatically, he is doing well.  Exam is stable.  Plan: 1.   Labs today: CBC with diff, CMP, ferritin, iron saturation, and AFP. 2.   Hereditary hemochromatosis  Clinically, he is doing well.    Exam reveals no abnormality.    Discuss plan for phlebotomy if ferritin > 200  Typically single mutation characters do not have iron overload unless another mutation (not tested) is present.  Discuss surveillance for hepatocellular carcinoma with AFP and liver ultrasound every 6 months.   Reschedule right upper quadrant ultrasound. 3.   RN to call  patient about ferritin and AFP. 4.   RTC in 3 months for labs (CBC with diff, ferritin) and +/- phlebotomy. 5.   RTC in 6 months for MD assessment, labs (CBC with diff, CMP, ferritin, AFP- day before), and +/- phlebotomy.  I discussed the  assessment and treatment plan with the patient.  The patient was provided an opportunity to ask questions and all were answered.  The patient agreed with the plan and demonstrated an understanding of the instructions.  The patient was advised to call back if the symptoms worsen or if the condition fails to improve as anticipated.    Lequita Asal, MD, PhD    10/21/2019, 9:11 AM  I, De Burrs, am acting as Education administrator for [employer]. Mike Gip, MD, PhD.  I, Annabel Gibeau C. Mike Gip, MD, have reviewed the above documentation for accuracy and completeness, and I agree with the above.

## 2019-10-21 ENCOUNTER — Inpatient Hospital Stay: Payer: BC Managed Care – PPO | Attending: Hematology and Oncology | Admitting: Hematology and Oncology

## 2019-10-21 ENCOUNTER — Inpatient Hospital Stay: Payer: BC Managed Care – PPO

## 2019-10-21 ENCOUNTER — Encounter: Payer: Self-pay | Admitting: Hematology and Oncology

## 2019-10-21 ENCOUNTER — Telehealth: Payer: Self-pay

## 2019-10-21 ENCOUNTER — Other Ambulatory Visit: Payer: Self-pay

## 2019-10-21 DIAGNOSIS — R410 Disorientation, unspecified: Secondary | ICD-10-CM | POA: Insufficient documentation

## 2019-10-21 DIAGNOSIS — Z8379 Family history of other diseases of the digestive system: Secondary | ICD-10-CM | POA: Insufficient documentation

## 2019-10-21 DIAGNOSIS — Z8042 Family history of malignant neoplasm of prostate: Secondary | ICD-10-CM | POA: Diagnosis not present

## 2019-10-21 DIAGNOSIS — Z85828 Personal history of other malignant neoplasm of skin: Secondary | ICD-10-CM | POA: Diagnosis not present

## 2019-10-21 DIAGNOSIS — Z79899 Other long term (current) drug therapy: Secondary | ICD-10-CM | POA: Diagnosis not present

## 2019-10-21 DIAGNOSIS — Z833 Family history of diabetes mellitus: Secondary | ICD-10-CM | POA: Insufficient documentation

## 2019-10-21 DIAGNOSIS — Z836 Family history of other diseases of the respiratory system: Secondary | ICD-10-CM | POA: Diagnosis not present

## 2019-10-21 LAB — CBC WITH DIFFERENTIAL/PLATELET
Abs Immature Granulocytes: 0.04 10*3/uL (ref 0.00–0.07)
Basophils Absolute: 0.1 10*3/uL (ref 0.0–0.1)
Basophils Relative: 1 %
Eosinophils Absolute: 0.1 10*3/uL (ref 0.0–0.5)
Eosinophils Relative: 1 %
HCT: 46.1 % (ref 39.0–52.0)
Hemoglobin: 15.7 g/dL (ref 13.0–17.0)
Immature Granulocytes: 1 %
Lymphocytes Relative: 30 %
Lymphs Abs: 2.4 10*3/uL (ref 0.7–4.0)
MCH: 31.3 pg (ref 26.0–34.0)
MCHC: 34.1 g/dL (ref 30.0–36.0)
MCV: 92 fL (ref 80.0–100.0)
Monocytes Absolute: 0.9 10*3/uL (ref 0.1–1.0)
Monocytes Relative: 11 %
Neutro Abs: 4.7 10*3/uL (ref 1.7–7.7)
Neutrophils Relative %: 56 %
Platelets: 251 10*3/uL (ref 150–400)
RBC: 5.01 MIL/uL (ref 4.22–5.81)
RDW: 12.7 % (ref 11.5–15.5)
WBC: 8.1 10*3/uL (ref 4.0–10.5)
nRBC: 0 % (ref 0.0–0.2)

## 2019-10-21 LAB — IRON AND TIBC
Iron: 127 ug/dL (ref 45–182)
Saturation Ratios: 40 % — ABNORMAL HIGH (ref 17.9–39.5)
TIBC: 319 ug/dL (ref 250–450)
UIBC: 192 ug/dL

## 2019-10-21 LAB — COMPREHENSIVE METABOLIC PANEL
ALT: 57 U/L — ABNORMAL HIGH (ref 0–44)
AST: 32 U/L (ref 15–41)
Albumin: 4.3 g/dL (ref 3.5–5.0)
Alkaline Phosphatase: 60 U/L (ref 38–126)
Anion gap: 8 (ref 5–15)
BUN: 20 mg/dL (ref 6–20)
CO2: 27 mmol/L (ref 22–32)
Calcium: 9.1 mg/dL (ref 8.9–10.3)
Chloride: 103 mmol/L (ref 98–111)
Creatinine, Ser: 1.01 mg/dL (ref 0.61–1.24)
GFR calc Af Amer: >60 mL/min (ref >60)
GFR calc non Af Amer: >60 mL/min (ref >60)
Glucose, Bld: 113 mg/dL — ABNORMAL HIGH (ref 70–99)
Potassium: 4.5 mmol/L (ref 3.5–5.1)
Sodium: 138 mmol/L (ref 135–145)
Total Bilirubin: 1 mg/dL (ref 0.3–1.2)
Total Protein: 7.6 g/dL (ref 6.5–8.1)

## 2019-10-21 LAB — FERRITIN: Ferritin: 198 ng/mL (ref 24–336)

## 2019-10-21 NOTE — Telephone Encounter (Signed)
-----   Message from Lequita Asal, MD sent at 10/21/2019  1:22 PM EDT ----- Regarding: Please call patient  Ferritin 198.  Iron saturation 40%.  He usually gets a phlebotomy with a ferritin of 200.  Offer phlebotomy.  We also discussed repeat LFTs in 2 weeks.  M ----- Message ----- From: Interface, Lab In [place] Sent: 10/21/2019   9:00 AM EDT To: Lequita Asal, MD

## 2019-10-21 NOTE — Telephone Encounter (Signed)
Patient would like to have a phlebotomy next Friday since he will be here for [place] that day anyway.  Would also like to schedule the lab in 2 weeks.

## 2019-10-21 NOTE — Progress Notes (Signed)
Patient denies new problems/concerns today.   °

## 2019-10-22 LAB — AFP TUMOR MARKER: AFP, Serum, Tumor Marker: 1.5 ng/mL (ref 0.0–8.3)

## 2019-10-25 ENCOUNTER — Telehealth: Payer: Self-pay | Admitting: Hematology and Oncology

## 2019-10-25 NOTE — Telephone Encounter (Signed)
I have left 2 messages for Ryan Calderon to call me back to schedule a phlebotomy appt and lab appt in 2 weeks. Left message that we ar closed 6/25 and his phlebotomy will have to be another day.

## 2019-10-29 ENCOUNTER — Ambulatory Visit
Admission: RE | Admit: 2019-10-29 | Discharge: 2019-10-29 | Disposition: A | Discharge status: Home / Self Care | Payer: BC Managed Care – PPO | Source: Ambulatory Visit | Attending: Hematology and Oncology | Admitting: Hematology and Oncology

## 2019-10-29 ENCOUNTER — Other Ambulatory Visit: Payer: Self-pay

## 2019-10-29 DIAGNOSIS — K8020 Calculus of gallbladder without cholecystitis without obstruction: Secondary | ICD-10-CM | POA: Diagnosis not present

## 2019-11-09 ENCOUNTER — Telehealth: Payer: Self-pay

## 2019-11-09 NOTE — Telephone Encounter (Signed)
Spoke wth the patient to infor him that he needed a phlebotomy. The patient was schedule for Friday. The patient was understanding and agreeable.

## 2019-11-09 NOTE — Telephone Encounter (Signed)
-----   Message from Lequita Asal, MD sent at 11/06/2019  7:27 PM EDT ----- Regarding: Patient needs phlebotomy

## 2019-11-12 ENCOUNTER — Inpatient Hospital Stay: Payer: BC Managed Care – PPO | Attending: Hematology and Oncology

## 2019-11-12 ENCOUNTER — Other Ambulatory Visit: Payer: Self-pay

## 2019-11-12 ENCOUNTER — Inpatient Hospital Stay: Payer: BC Managed Care – PPO

## 2019-11-12 LAB — HEPATIC FUNCTION PANEL
ALT: 60 U/L — ABNORMAL HIGH (ref 0–44)
AST: 32 U/L (ref 15–41)
Albumin: 4.2 g/dL (ref 3.5–5.0)
Alkaline Phosphatase: 63 U/L (ref 38–126)
Bilirubin, Direct: 0.1 mg/dL (ref 0.0–0.2)
Indirect Bilirubin: 0.8 mg/dL (ref 0.3–0.9)
Total Bilirubin: 0.9 mg/dL (ref 0.3–1.2)
Total Protein: 7.5 g/dL (ref 6.5–8.1)

## 2020-01-20 ENCOUNTER — Ambulatory Visit: Payer: BC Managed Care – PPO

## 2020-01-20 ENCOUNTER — Inpatient Hospital Stay: Payer: BC Managed Care – PPO | Attending: Hematology and Oncology

## 2020-01-20 ENCOUNTER — Other Ambulatory Visit: Payer: Self-pay

## 2020-01-20 ENCOUNTER — Other Ambulatory Visit: Payer: Self-pay | Admitting: Hematology and Oncology

## 2020-01-20 LAB — CBC WITH DIFFERENTIAL/PLATELET
Abs Immature Granulocytes: 0.06 10*3/uL (ref 0.00–0.07)
Basophils Absolute: 0.1 10*3/uL (ref 0.0–0.1)
Basophils Relative: 1 %
Eosinophils Absolute: 0.1 10*3/uL (ref 0.0–0.5)
Eosinophils Relative: 1 %
HCT: 44.6 % (ref 39.0–52.0)
Hemoglobin: 15.5 g/dL (ref 13.0–17.0)
Immature Granulocytes: 1 %
Lymphocytes Relative: 32 %
Lymphs Abs: 3.5 10*3/uL (ref 0.7–4.0)
MCH: 32 pg (ref 26.0–34.0)
MCHC: 34.8 g/dL (ref 30.0–36.0)
MCV: 92.1 fL (ref 80.0–100.0)
Monocytes Absolute: 0.9 10*3/uL (ref 0.1–1.0)
Monocytes Relative: 8 %
Neutro Abs: 6.3 10*3/uL (ref 1.7–7.7)
Neutrophils Relative %: 57 %
Platelets: 262 10*3/uL (ref 150–400)
RBC: 4.84 MIL/uL (ref 4.22–5.81)
RDW: 12.3 % (ref 11.5–15.5)
WBC: 11 10*3/uL — ABNORMAL HIGH (ref 4.0–10.5)
nRBC: 0 % (ref 0.0–0.2)

## 2020-01-20 LAB — FERRITIN: Ferritin: 159 ng/mL (ref 24–336)

## 2020-01-20 NOTE — Progress Notes (Signed)
Patient came into the clinic today for labs and possible phlebotomy. Patient had a CBC and Ferritin level drawn. Ferritin was sent to [facility] to be run. Explained to patient that we needed the Ferritin level to determine whether a phlebotomy was needed or not, and that we would not have it back until tomorrow. Told patient that we will contact him with the ferritin results and an appointment for Monday afternoon, if phlebotomy is needed. Patient verbalized understanding.

## 2020-01-24 DIAGNOSIS — Z0189 Encounter for other specified special examinations: Secondary | ICD-10-CM | POA: Diagnosis not present

## 2020-02-16 ENCOUNTER — Other Ambulatory Visit: Payer: Self-pay | Admitting: Nurse Practitioner

## 2020-04-14 ENCOUNTER — Other Ambulatory Visit: Payer: Self-pay

## 2020-04-19 ENCOUNTER — Inpatient Hospital Stay: Payer: BC Managed Care – PPO | Attending: Hematology and Oncology

## 2020-04-20 ENCOUNTER — Other Ambulatory Visit: Payer: BC Managed Care – PPO

## 2020-04-20 ENCOUNTER — Inpatient Hospital Stay: Payer: BC Managed Care – PPO

## 2020-04-20 ENCOUNTER — Inpatient Hospital Stay: Payer: BC Managed Care – PPO | Admitting: Nurse Practitioner

## 2020-06-25 DIAGNOSIS — Z85828 Personal history of other malignant neoplasm of skin: Secondary | ICD-10-CM | POA: Insufficient documentation

## 2020-06-27 DIAGNOSIS — L853 Xerosis cutis: Secondary | ICD-10-CM | POA: Diagnosis not present

## 2020-06-27 DIAGNOSIS — L570 Actinic keratosis: Secondary | ICD-10-CM | POA: Diagnosis not present

## 2020-06-27 DIAGNOSIS — R21 Rash and other nonspecific skin eruption: Secondary | ICD-10-CM | POA: Diagnosis not present

## 2020-06-27 DIAGNOSIS — L820 Inflamed seborrheic keratosis: Secondary | ICD-10-CM | POA: Diagnosis not present

## 2020-09-11 ENCOUNTER — Encounter: Payer: Self-pay | Admitting: Hematology and Oncology

## 2020-09-12 ENCOUNTER — Other Ambulatory Visit: Payer: Self-pay

## 2020-09-13 ENCOUNTER — Other Ambulatory Visit: Payer: Self-pay

## 2020-09-13 ENCOUNTER — Inpatient Hospital Stay: Payer: BC Managed Care – PPO | Attending: Oncology

## 2020-09-13 ENCOUNTER — Inpatient Hospital Stay: Payer: BC Managed Care – PPO

## 2020-09-13 ENCOUNTER — Inpatient Hospital Stay: Payer: BC Managed Care – PPO | Admitting: Oncology

## 2020-09-13 LAB — COMPREHENSIVE METABOLIC PANEL
ALT: 41 U/L (ref 0–44)
AST: 25 U/L (ref 15–41)
Albumin: 4.2 g/dL (ref 3.5–5.0)
Alkaline Phosphatase: 59 U/L (ref 38–126)
Anion gap: 7 (ref 5–15)
BUN: 18 mg/dL (ref 6–20)
CO2: 27 mmol/L (ref 22–32)
Calcium: 9 mg/dL (ref 8.9–10.3)
Chloride: 103 mmol/L (ref 98–111)
Creatinine, Ser: 0.89 mg/dL (ref 0.61–1.24)
GFR, Estimated: >60 mL/min (ref >60)
Glucose, Bld: 90 mg/dL (ref 70–99)
Potassium: 3.7 mmol/L (ref 3.5–5.1)
Sodium: 137 mmol/L (ref 135–145)
Total Bilirubin: 1.3 mg/dL — ABNORMAL HIGH (ref 0.3–1.2)
Total Protein: 7.5 g/dL (ref 6.5–8.1)

## 2020-09-13 LAB — CBC WITH DIFFERENTIAL/PLATELET
Abs Immature Granulocytes: 0.04 10*3/uL (ref 0.00–0.07)
Basophils Absolute: 0.1 10*3/uL (ref 0.0–0.1)
Basophils Relative: 1 %
Eosinophils Absolute: 0.2 10*3/uL (ref 0.0–0.5)
Eosinophils Relative: 2 %
HCT: 44.2 % (ref 39.0–52.0)
Hemoglobin: 15.5 g/dL (ref 13.0–17.0)
Immature Granulocytes: 0 %
Lymphocytes Relative: 29 %
Lymphs Abs: 2.9 10*3/uL (ref 0.7–4.0)
MCH: 31.8 pg (ref 26.0–34.0)
MCHC: 35.1 g/dL (ref 30.0–36.0)
MCV: 90.8 fL (ref 80.0–100.0)
Monocytes Absolute: 0.9 10*3/uL (ref 0.1–1.0)
Monocytes Relative: 9 %
Neutro Abs: 6.1 10*3/uL (ref 1.7–7.7)
Neutrophils Relative %: 59 %
Platelets: 238 10*3/uL (ref 150–400)
RBC: 4.87 MIL/uL (ref 4.22–5.81)
RDW: 12.5 % (ref 11.5–15.5)
WBC: 10.2 10*3/uL (ref 4.0–10.5)
nRBC: 0 % (ref 0.0–0.2)

## 2020-09-13 LAB — FERRITIN: Ferritin: 166 ng/mL (ref 24–336)

## 2020-09-14 LAB — AFP TUMOR MARKER: AFP, Serum, Tumor Marker: 1.8 ng/mL (ref 0.0–6.9)

## 2020-09-15 ENCOUNTER — Other Ambulatory Visit: Payer: Self-pay

## 2020-09-20 ENCOUNTER — Inpatient Hospital Stay (HOSPITAL_BASED_OUTPATIENT_CLINIC_OR_DEPARTMENT_OTHER): Payer: BC Managed Care – PPO | Admitting: Oncology

## 2020-09-20 ENCOUNTER — Other Ambulatory Visit: Payer: Self-pay

## 2020-09-20 ENCOUNTER — Encounter: Payer: Self-pay | Admitting: Oncology

## 2020-09-20 NOTE — Progress Notes (Signed)
Hematology/Oncology Consult note [facility]  Telephone:([phone] [phone]  Patient Care Team: Lequita Asal, MD as PCP - General (Hematology and Oncology) Erby Pian, MD as Referring Physician (Specialist)   Name of the patient: Ryan Calderon  [id number]  16-Jan-1971   Date of visit: 09/20/20  Diagnosis-history of hereditary hemochromatosis with single gene mutation for H63D  Chief complaint/ Reason for visit-routine follow-up of hereditary hemochromatosis  Heme/Onc history: Patient is a 50 year old male who was diagnosed with single gene mutation for H63D back in 2019 when he had seen Dr. Rogue Bussing.  He had elevated ferritin in the 600s at that time and had to undergo phlebotomy.  At that time he was symptomatic with worsening fatigue and joint pains which improved after phlebotomy.  He has not required phlebotomy for over a year now and the plan was to keep his ferritin less than 200.  He subsequently transferred care to Dr. Mike Gip was also getting [facility] surveillance.  No known history of cirrhosis.  No family history of cirrhosis  Interval history-patient reports doing well.  He drinks about 3-4 beers weekly.  Denies any significant fatigue at this time.  Denies any joint pain  ECOG PS- 0 Pain scale- 0   Review of systems- Review of Systems  Constitutional: Negative for chills, fever, malaise/fatigue and weight loss.  HENT: Negative for congestion, ear discharge and nosebleeds.   Eyes: Negative for blurred vision.  Respiratory: Negative for cough, hemoptysis, sputum production, shortness of breath and wheezing.   Cardiovascular: Negative for chest pain, palpitations, orthopnea and claudication.  Gastrointestinal: Negative for abdominal pain, blood in stool, constipation, diarrhea, heartburn, melena, nausea and vomiting.  Genitourinary: Negative for dysuria, flank pain, frequency, hematuria and urgency.  Musculoskeletal: Negative  for back pain, joint pain and myalgias.  Skin: Negative for rash.  Neurological: Negative for dizziness, tingling, focal weakness, seizures, weakness and headaches.  Endo/Heme/Allergies: Does not bruise/bleed easily.  Psychiatric/Behavioral: Negative for depression and suicidal ideas. The patient does not have insomnia.      Allergies  Allergen Reactions  . Peanut-Containing Drug Products Other (See Comments)    Patient allergic to peanuts - states it make him unable to talk above a whisper.     Past Medical History:  Diagnosis Date  . BCC (basal cell carcinoma of skin)   . Hereditary hemochromatosis   . Hyperlipidemia   . Pneumothorax    Complicated course, Dr. Delilah Shan followed     History reviewed. No pertinent surgical history.  Social History   Socioeconomic History  . Marital status: Married    Spouse name: Not on file  . Number of children: Not on file  . Years of education: Not on file  . Highest education level: Not on file  Occupational History  . Not on file  Tobacco Use  . Smoking status: Never Smoker  . Smokeless tobacco: Current User    Types: Snuff  . Tobacco comment: 64yrs with snuff  Substance and Sexual Activity  . Alcohol use: Yes    Alcohol/week: 0.0 standard drinks    Comment: ocassional beer  . Drug use: No  . Sexual activity: Yes    Partners: Female  Other Topics Concern  . 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  [employer].  Explosive ordinance disposal,  forward air controller.   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     Social Determinants of Health   Financial Resource Strain: Not on file  Food Insecurity: Not on file  Transportation Needs: Not on file  Physical Activity: Not on file  Stress: Not on file  Social Connections: Not on file  Intimate Partner  Violence: Not on file    Family History  Problem Relation Age of Onset  . Prostate cancer Father 44  . COPD Father        smoker  . Diabetes Other   . Liver disease Brother   . Colon cancer Neg Hx      Current Outpatient Medications:  .  CALCIUM-MAGNESIUM-VITAMIN D PO, Take by mouth., Disp: , Rfl:  .  cholecalciferol (VITAMIN D3) 25 MCG (1000 UNIT) tablet, Take 1,000 Units by mouth daily., Disp: , Rfl:  .  DHA-EPA-Vitamin E (OMEGA-3 COMPLEX PO), Take by mouth., Disp: , Rfl:  .  DHA-EPA-VITAMIN E PO, Take by mouth., Disp: , Rfl:  .  Multiple Vitamin (MULTIVITAMIN) tablet, Take 1 tablet by mouth daily., Disp: , Rfl:  .  triamcinolone (KENALOG) 0.025 % ointment, Apply topically., Disp: , Rfl:   Physical exam:  Vitals:   09/20/20 1317  BP: (!) 125/95  Pulse: 83  Resp: 20  Temp: 97.6 F (36.4 C)  SpO2: 97%  Weight: 275 lb 2.2 oz (124.8 kg)   Physical Exam Constitutional:      General: He is not in acute distress. Cardiovascular:     Rate and Rhythm: Normal rate and regular rhythm.     Heart sounds: Normal heart sounds.  Pulmonary:     Effort: Pulmonary effort is normal.     Breath sounds: Normal breath sounds.  Abdominal:     General: Bowel sounds are normal.     Palpations: Abdomen is soft.  Skin:    General: Skin is warm and dry.  Neurological:     Mental Status: He is alert and oriented to person, place, and time.      CMP Latest Ref Rng & Units 09/13/2020  Glucose 70 - 99 mg/dL 90  BUN 6 - 20 mg/dL 18  Creatinine 0.61 - 1.24 mg/dL 0.89  Sodium 135 - 145 mmol/L 137  Potassium 3.5 - 5.1 mmol/L 3.7  Chloride 98 - 111 mmol/L 103  CO2 22 - 32 mmol/L 27  Calcium 8.9 - 10.3 mg/dL 9.0  Total Protein 6.5 - 8.1 g/dL 7.5  Total Bilirubin 0.3 - 1.2 mg/dL 1.3(H)  Alkaline Phos 38 - 126 U/L 59  AST 15 - 41 U/L 25  ALT 0 - 44 U/L 41   CBC Latest Ref Rng & Units 09/13/2020  WBC 4.0 - 10.5 K/uL 10.2  Hemoglobin 13.0 - 17.0 g/dL 15.5  Hematocrit 39.0 - 52.0 % 44.2   Platelets 150 - 400 K/uL 238     Assessment and plan- Patient is a 50 y.o. male was history of hereditary hemochromatosis with single gene mutation for H63D without clinical iron overload here for routine follow-up  Discussed with the patient that single gene mutation for H63D does not typically lead to iron overload.  In patients who do not have symptomatic iron overload phlebotomy is not indicated until ferritin is greater than 500.  In the past in 2019 when patient's ferritin was in the 600s patient also had some symptoms of fatigue and joint pain as well as abnormal LFTs which improved after phlebotomy.  He has not required any phlebotomy for over a year now.  His ferritin levels today are 166 with normal LFTs and he does not require phlebotomy at this time  Also patient does not have any underlying cirrhosis and therefore does not require any HCC screening in the absence of cirrhosis  Advised patient to cut down on his alcohol intake as that can also elevate ferritin and could cause abnormal LFTs.  I will see him back in 6 months with CBC ferritin and iron studies CMP   Visit Diagnosis 1. Hereditary hemochromatosis ([place])      Dr. Randa Evens, MD, MPH [facility] [phone] 09/20/2020 5:15 PM

## 2020-10-13 ENCOUNTER — Ambulatory Visit: Payer: BC Managed Care – PPO | Admitting: Family Medicine

## 2020-10-20 ENCOUNTER — Other Ambulatory Visit: Payer: Self-pay

## 2020-10-20 ENCOUNTER — Encounter: Payer: Self-pay | Admitting: Family Medicine

## 2020-10-20 ENCOUNTER — Ambulatory Visit: Payer: BC Managed Care – PPO | Admitting: Family Medicine

## 2020-10-20 VITALS — BP 120/82 | HR 76 | Temp 97.4°F | Ht 73.0 in | Wt 267.0 lb

## 2020-10-20 DIAGNOSIS — Z0000 Encounter for general adult medical examination without abnormal findings: Secondary | ICD-10-CM

## 2020-10-20 DIAGNOSIS — K8020 Calculus of gallbladder without cholecystitis without obstruction: Secondary | ICD-10-CM

## 2020-10-20 DIAGNOSIS — Z125 Encounter for screening for malignant neoplasm of prostate: Secondary | ICD-10-CM

## 2020-10-20 DIAGNOSIS — K760 Fatty (change of) liver, not elsewhere classified: Secondary | ICD-10-CM

## 2020-10-20 NOTE — Patient Instructions (Addendum)
Please check your family history.  If your dad had colon cancer, then you need a colonoscopy.  If no history then you have other additional options.    I"ll put in labs for your next hematology appointment.   Take care.  Glad to see you.

## 2020-10-20 NOTE — Progress Notes (Signed)
This visit occurred during the SARS-CoV-2 public health emergency.  Safety protocols were in place, including screening questions prior to the visit, additional usage of staff PPE, and extensive cleaning of exam room while observing appropriate contact time as indicated for disinfecting solutions.  CPE- See plan.  Routine anticipatory guidance given to patient.  See health maintenance.  The possibility exists that previously documented standard health maintenance information may have been brought forward from a previous encounter into this note.  If needed, that same information has been updated to reflect the current situation based on today's encounter.    He thought he had a tetanus shot around 2015 with a foot injury.  Discussed. PNA and shingles not due. Flu shot encouraged. Covid vaccine encoruaged, declined by patient. "I do not trust Dr. Brenton Grills."  Discussed.  I asked him if he trusts that I give him good medical advice.  The point of that is that if he does not think I give good advice he can seek care elsewhere.  He can consider.  And I advised him to get vaccinated. D/w patient [id number] for colon cancer screening, including IFOB vs. colonoscopy.  Risks and benefits of both were discussed and patient voiced understanding.  Pt is going to check on FH of colon cancer.  That would change the plan.  Discussed in detail. PSA pending with next set of labs.   Living will d/w pt.  Wife designated if patient were incapacitated.   [facility]  Diet and exercise d/w pt.    Per patient, he was told "My liver is fine."  Fatty liver and gallstone noted on u/s.  He cut back on etoh.  D/w pt. by definition he does not have normal liver imaging, given the fatty deposits.  Discussed diet and exercise and weight loss.  History of elevated ferritin, followed by hematology and I will defer.  He should be able to get his follow-up labs drawn with next hematology  appointment.  PMH and SH reviewed  Meds, vitals, and allergies reviewed.   ROS: Per HPI.  Unless specifically indicated otherwise in HPI, the patient denies:  General: fever. Eyes: acute vision changes ENT: sore throat Cardiovascular: chest pain Respiratory: SOB GI: vomiting GU: dysuria Musculoskeletal: acute back pain Derm: acute rash Neuro: acute motor dysfunction Psych: worsening mood Endocrine: polydipsia Heme: bleeding Allergy: hayfever  GEN: nad, alert and oriented HEENT: ncat NECK: supple w/o LA CV: rrr. PULM: ctab, no inc wob ABD: soft, +bs EXT: no edema SKIN: no acute rash

## 2020-10-22 DIAGNOSIS — K760 Fatty (change of) liver, not elsewhere classified: Secondary | ICD-10-CM | POA: Insufficient documentation

## 2020-10-22 DIAGNOSIS — K8020 Calculus of gallbladder without cholecystitis without obstruction: Secondary | ICD-10-CM | POA: Insufficient documentation

## 2020-10-22 NOTE — Assessment & Plan Note (Signed)
Per patient, he was told "My liver is fine."  Fatty liver and gallstone noted on u/s.  He cut back on etoh.  D/w pt. by definition he does not have normal liver imaging, given the fatty deposits.  Discussed diet and exercise and weight loss.

## 2020-10-22 NOTE — Assessment & Plan Note (Addendum)
He thought he had a tetanus shot around 2015 with a foot injury.  Discussed. PNA and shingles not due. Flu shot encouraged. Covid vaccine encoruaged, declined by patient. "I do not trust Dr. Brenton Grills."  Discussed.  I asked him if he trusts that I give him good medical advice.  The point of that is that if he does not think I give good advice he can seek care elsewhere.  He can consider.  And I advised him to get vaccinated. D/w patient [id number] for colon cancer screening, including IFOB vs. colonoscopy.  Risks and benefits of both were discussed and patient voiced understanding.  Pt is going to check on FH of colon cancer.  That would change the plan.  Discussed in detail. PSA pending with next set of labs.   Living will d/w pt.  Wife designated if patient were incapacitated.   [facility] HCV [facility].  Diet and exercise d/w pt.

## 2020-10-22 NOTE — Assessment & Plan Note (Signed)
History of elevated ferritin, followed by hematology and I will defer.

## 2020-10-22 NOTE — Assessment & Plan Note (Signed)
Abdomen not tender.

## 2021-03-16 ENCOUNTER — Other Ambulatory Visit: Payer: Self-pay

## 2021-03-16 ENCOUNTER — Other Ambulatory Visit: Payer: BC Managed Care – PPO

## 2021-03-16 ENCOUNTER — Inpatient Hospital Stay: Payer: BC Managed Care – PPO | Attending: Oncology

## 2021-03-16 DIAGNOSIS — Z720 Tobacco use: Secondary | ICD-10-CM | POA: Insufficient documentation

## 2021-03-16 DIAGNOSIS — R5383 Other fatigue: Secondary | ICD-10-CM | POA: Insufficient documentation

## 2021-03-16 LAB — COMPREHENSIVE METABOLIC PANEL
ALT: 42 U/L (ref 0–44)
AST: 25 U/L (ref 15–41)
Albumin: 4.3 g/dL (ref 3.5–5.0)
Alkaline Phosphatase: 62 U/L (ref 38–126)
Anion gap: 7 (ref 5–15)
BUN: 18 mg/dL (ref 6–20)
CO2: 27 mmol/L (ref 22–32)
Calcium: 8.9 mg/dL (ref 8.9–10.3)
Chloride: 102 mmol/L (ref 98–111)
Creatinine, Ser: 1.01 mg/dL (ref 0.61–1.24)
GFR, Estimated: >60 mL/min (ref >60)
Glucose, Bld: 103 mg/dL — ABNORMAL HIGH (ref 70–99)
Potassium: 4.1 mmol/L (ref 3.5–5.1)
Sodium: 136 mmol/L (ref 135–145)
Total Bilirubin: 0.9 mg/dL (ref 0.3–1.2)
Total Protein: 7.4 g/dL (ref 6.5–8.1)

## 2021-03-16 LAB — CBC WITH DIFFERENTIAL/PLATELET
Abs Immature Granulocytes: 0.03 10*3/uL (ref 0.00–0.07)
Basophils Absolute: 0.1 10*3/uL (ref 0.0–0.1)
Basophils Relative: 1 %
Eosinophils Absolute: 0.1 10*3/uL (ref 0.0–0.5)
Eosinophils Relative: 1 %
HCT: 45.4 % (ref 39.0–52.0)
Hemoglobin: 15.7 g/dL (ref 13.0–17.0)
Immature Granulocytes: 0 %
Lymphocytes Relative: 30 %
Lymphs Abs: 2.9 10*3/uL (ref 0.7–4.0)
MCH: 31.8 pg (ref 26.0–34.0)
MCHC: 34.6 g/dL (ref 30.0–36.0)
MCV: 91.9 fL (ref 80.0–100.0)
Monocytes Absolute: 1 10*3/uL (ref 0.1–1.0)
Monocytes Relative: 10 %
Neutro Abs: 5.6 10*3/uL (ref 1.7–7.7)
Neutrophils Relative %: 58 %
Platelets: 233 10*3/uL (ref 150–400)
RBC: 4.94 MIL/uL (ref 4.22–5.81)
RDW: 12.3 % (ref 11.5–15.5)
WBC: 9.7 10*3/uL (ref 4.0–10.5)
nRBC: 0 % (ref 0.0–0.2)

## 2021-03-16 LAB — FERRITIN: Ferritin: 220 ng/mL (ref 24–336)

## 2021-03-28 ENCOUNTER — Encounter: Payer: Self-pay | Admitting: Oncology

## 2021-03-28 ENCOUNTER — Telehealth: Payer: BC Managed Care – PPO | Admitting: Oncology

## 2021-03-28 ENCOUNTER — Inpatient Hospital Stay (HOSPITAL_BASED_OUTPATIENT_CLINIC_OR_DEPARTMENT_OTHER): Payer: BC Managed Care – PPO | Admitting: Oncology

## 2021-03-28 ENCOUNTER — Other Ambulatory Visit: Payer: Self-pay

## 2021-03-28 NOTE — Progress Notes (Signed)
Will like to know if he can come here for all of his bloodwork that his PCP will need at times.

## 2021-04-01 ENCOUNTER — Encounter: Payer: Self-pay | Admitting: Internal Medicine

## 2021-04-01 NOTE — Progress Notes (Signed)
I connected with Ryan Calderon on 04/01/21 at  2:15 PM EST by video enabled telemedicine visit and verified that I am speaking with the correct person using two identifiers.   I discussed the limitations, risks, security and privacy concerns of performing an evaluation and management service by telemedicine and the availability of in-person appointments. I also discussed with the patient that there may be a patient responsible charge related to this service. The patient expressed understanding and agreed to proceed.  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 Rogue Bussing.  He had elevated ferritin in the 600s at that time and had to undergo phlebotomy.  At that time he was symptomatic with worsening fatigue and joint pains which improved after phlebotomy.  He has not required phlebotomy for over a year now and the plan was to keep his ferritin less than 200.  He subsequently transferred care to Dr. Mike Gip was also getting [facility] surveillance.  No known history of cirrhosis.  No family history of cirrhosis  Interval history overall patient is doing well and denies any specific complaints at this time   Review of Systems  Constitutional:  Negative for chills, fever, malaise/fatigue and weight loss.  HENT:  Negative for congestion, ear discharge and nosebleeds.   Eyes:  Negative for blurred vision.  Respiratory:  Negative for cough, hemoptysis, sputum production, shortness of breath and wheezing.   Cardiovascular:  Negative for chest pain, palpitations, orthopnea and claudication.  Gastrointestinal:  Negative for  abdominal pain, blood in stool, constipation, diarrhea, heartburn, melena, nausea and vomiting.  Genitourinary:  Negative for dysuria, flank pain, frequency, hematuria and urgency.  Musculoskeletal:  Negative for back pain, joint pain and myalgias.  Skin:  Negative for rash.  Neurological:  Negative for dizziness, tingling, focal weakness, seizures, weakness and headaches.  Endo/Heme/Allergies:  Does not bruise/bleed easily.  Psychiatric/Behavioral:  Negative for depression and suicidal ideas. The patient does not have insomnia.    Allergies  Allergen Reactions   Peanut-Containing Drug Products Other (See Comments)    Patient allergic to peanuts - states it make him unable to talk above a whisper.    Past Medical History:  Diagnosis Date   BCC (basal cell carcinoma of skin)    Fatty liver    Hereditary hemochromatosis    Hyperlipidemia    Pneumothorax    Complicated course, Dr. Delilah Shan followed    History reviewed. No pertinent surgical history.  Social History   Socioeconomic History   Marital status: Married    Spouse name: Not on file   Number of children: Not on file   Years of education: Not on file   Highest education level: Not on file  Occupational History   Not on file  Tobacco Use   Smoking status: Never   Smokeless tobacco: Current    Types: Snuff   Tobacco comments:    75yrs with snuff  Substance and Sexual Activity   Alcohol use: Yes    Alcohol/week: 0.0 standard drinks    Comment: ocassional beer   Drug use: No   Sexual activity: Yes    Partners: Female  Other Topics Concern  Not on file  Social History Narrative   Married Ryan Calderon (large conduit projects)   2 sons (born 2000, 2002), both work with patient at his [place] [phone].  [employer].  Explosive ordinance disposal, forward air controller.   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     Social Determinants of Health   Financial  Resource Strain: Not on file  Food Insecurity: Not on file  Transportation Needs: Not on file  Physical Activity: Not on file  Stress: Not on file  Social Connections: Not on file  Intimate Partner Violence: Not on file    Family History  Problem Relation Age of Onset   Prostate cancer Father 28   COPD Father        smoker   Diabetes Other    Liver disease Brother    Colon cancer Neg Hx      Current Outpatient Medications:    CALCIUM-MAGNESIUM-VITAMIN D PO, Take by mouth., Disp: , Rfl:    cholecalciferol (VITAMIN D3) 25 MCG (1000 UNIT) tablet, Take 1,000 Units by mouth daily., Disp: , Rfl:    DHA-EPA-VITAMIN E PO, Take by mouth., Disp: , Rfl:    Multiple Vitamin (MULTIVITAMIN) tablet, Take 1 tablet by mouth daily., Disp: , Rfl:    triamcinolone (KENALOG) 0.025 % ointment, Apply topically., Disp: , Rfl:   No results found.  No images are attached to the encounter.   CMP Latest Ref Rng & Units 03/16/2021  Glucose 70 - 99 mg/dL 103(H)  BUN 6 - 20 mg/dL 18  Creatinine 0.61 - 1.24 mg/dL 1.01  Sodium 135 - 145 mmol/L 136  Potassium 3.5 - 5.1 mmol/L 4.1  Chloride 98 - 111 mmol/L 102  CO2 22 - 32 mmol/L 27  Calcium 8.9 - 10.3 mg/dL 8.9  Total Protein 6.5 - 8.1 g/dL 7.4  Total Bilirubin 0.3 - 1.2 mg/dL 0.9  Alkaline Phos 38 - 126 U/L 62  AST 15 - 41 U/L 25  ALT 0 - 44 U/L 42   CBC Latest Ref Rng & Units 03/16/2021  WBC 4.0 - 10.5 K/uL 9.7  Hemoglobin 13.0 - 17.0 g/dL 15.7  Hematocrit 39.0 - 52.0 % 45.4  Platelets 150 - 400 K/uL 233     Observation/objective:Appears in no acute distress over video visit today.  Breathing is nonlabored  Assessment and plan: Patient is a 50 year old male with a history of hereditary hemochromatosis and single gene mutation for H63D without any evidence of clinically iron overload and this is a routine follow-up visit  Patient CBC is normal and CMP is normal as well with normal LFTs.  Ferritin levels at 220.  Evidence of iron overload at  this time and therefore phlebotomy is not indicated.  He does not have any underlying cirrhosis and therefore does not require [place] surveillance.  I will see him back with CBC with differential CMP ferritin and iron studies in 6 months in person.  Patient wishes his PSA to be checked yearly as he gets blood work through as more often than his primary care doctor.  I will add that in 6 months  Follow-up instructions: Labs and see me in 6 months  I discussed the assessment and treatment plan with the patient. The patient was provided an opportunity to ask questions and all were answered. The patient agreed with the plan and demonstrated an understanding of the instructions.   The patient was advised to call back  or seek an in-person evaluation if the symptoms worsen or if the condition fails to improve as anticipated.  Visit Diagnosis: 1. Hereditary hemochromatosis ([place])     Dr. Randa Evens, MD, MPH [facility] at [facility] Tel- [phone] 04/01/2021 8:03 PM

## 2021-07-17 DIAGNOSIS — D1801 Hemangioma of skin and subcutaneous tissue: Secondary | ICD-10-CM | POA: Diagnosis not present

## 2021-07-17 DIAGNOSIS — L821 Other seborrheic keratosis: Secondary | ICD-10-CM | POA: Diagnosis not present

## 2021-07-17 DIAGNOSIS — L578 Other skin changes due to chronic exposure to nonionizing radiation: Secondary | ICD-10-CM | POA: Diagnosis not present

## 2021-07-17 DIAGNOSIS — Z85828 Personal history of other malignant neoplasm of skin: Secondary | ICD-10-CM | POA: Diagnosis not present

## 2021-07-17 DIAGNOSIS — D229 Melanocytic nevi, unspecified: Secondary | ICD-10-CM | POA: Diagnosis not present

## 2021-07-17 DIAGNOSIS — L814 Other melanin hyperpigmentation: Secondary | ICD-10-CM | POA: Diagnosis not present

## 2021-07-24 DIAGNOSIS — D492 Neoplasm of unspecified behavior of bone, soft tissue, and skin: Secondary | ICD-10-CM | POA: Diagnosis not present

## 2021-08-06 IMAGING — US US ABDOMEN LIMITED
1 series · 14 of 25 positions shown · non-contrast
Comparison: None.

CLINICAL DATA: Hemochromatosis, HCC surveillance

EXAM:
ULTRASOUND ABDOMEN LIMITED RIGHT UPPER QUADRANT

[Series 1: us abdomen limited · 0.30mm/px · 14 of 40 slices shown]
[im 1/40]
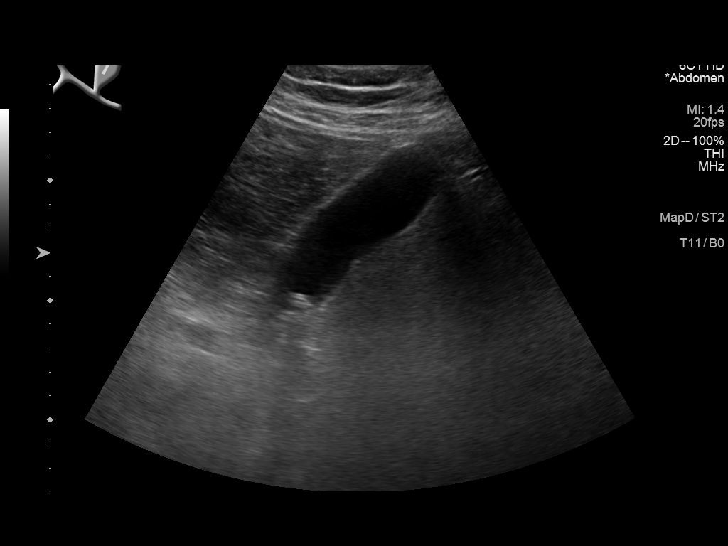
[im 4/40]
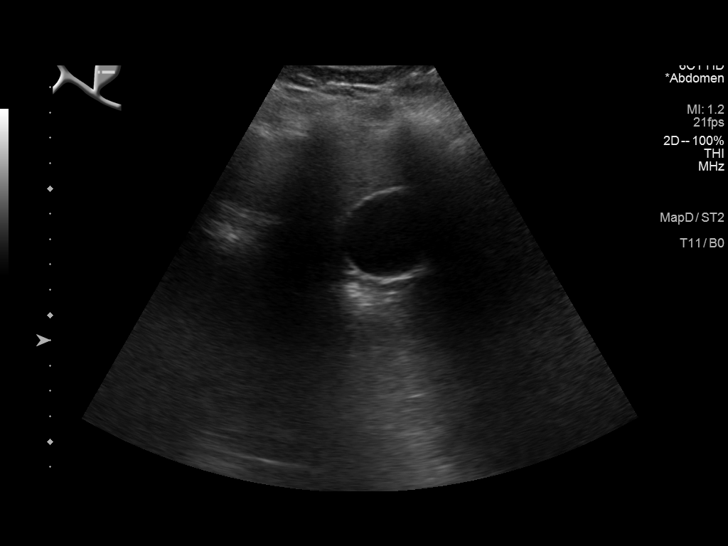
[im 7/40]
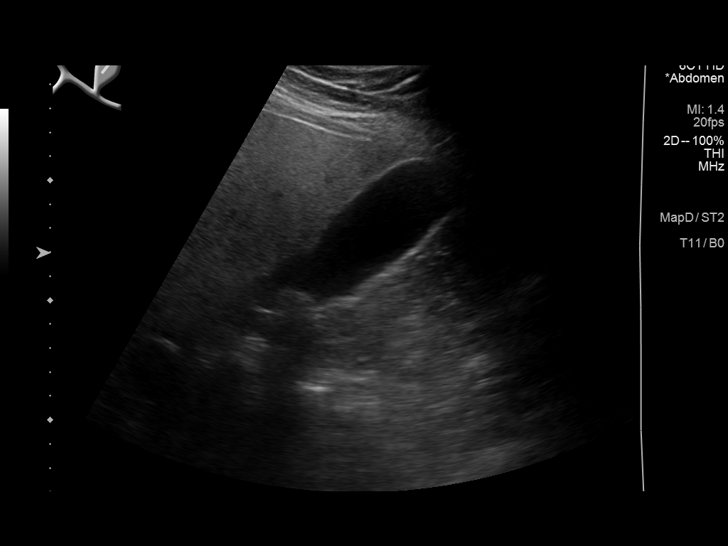
[im 10/40]
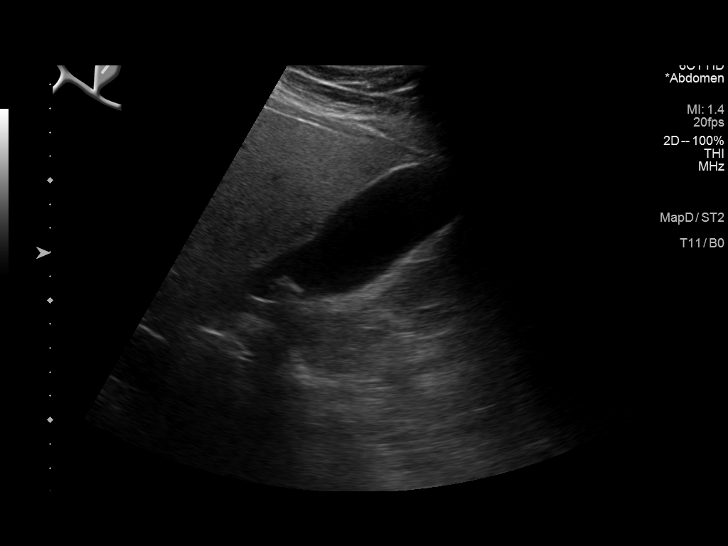
[im 14/40]
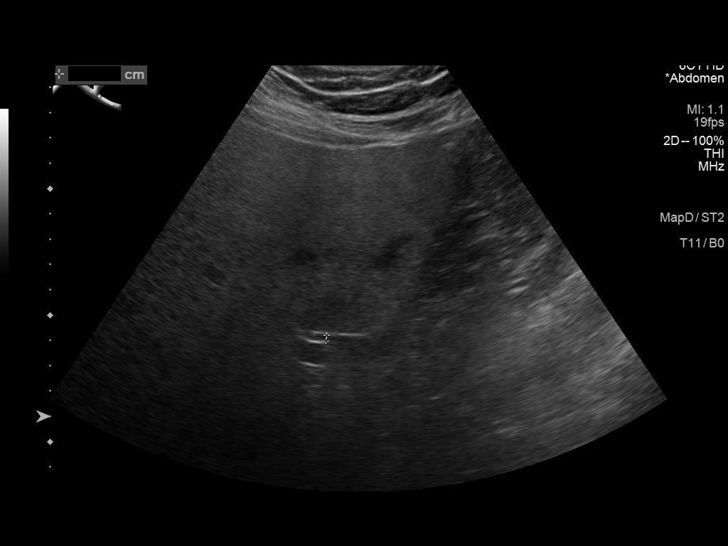
[im 15/40]
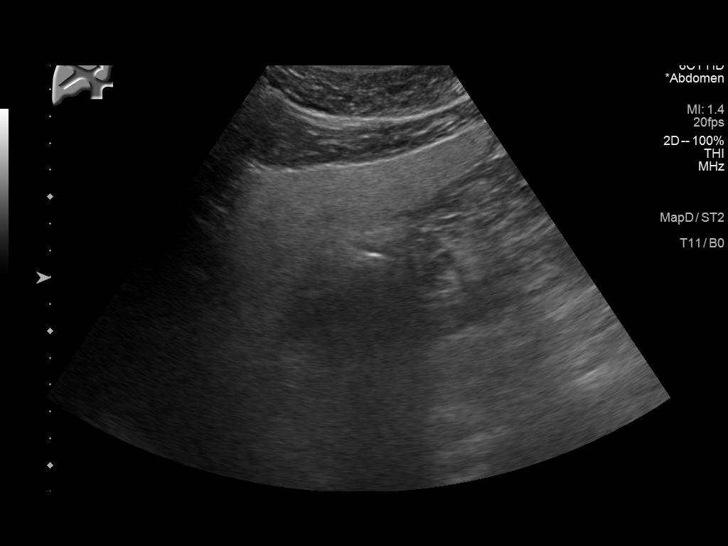
[im 18/40]
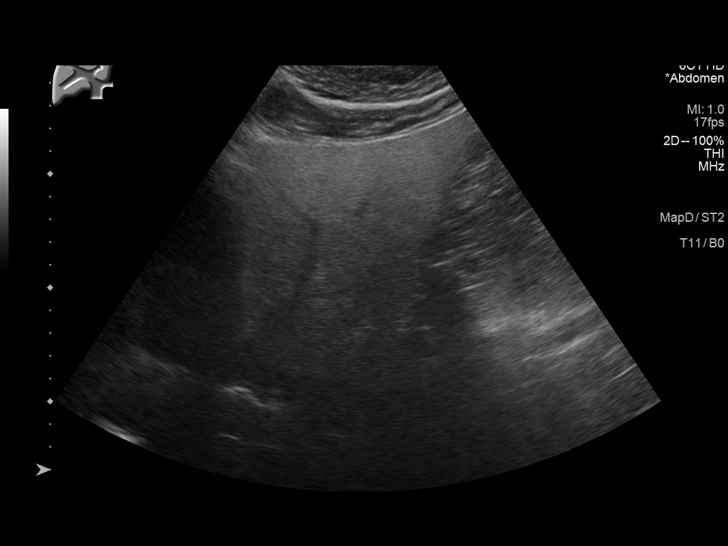
[im 22/40]
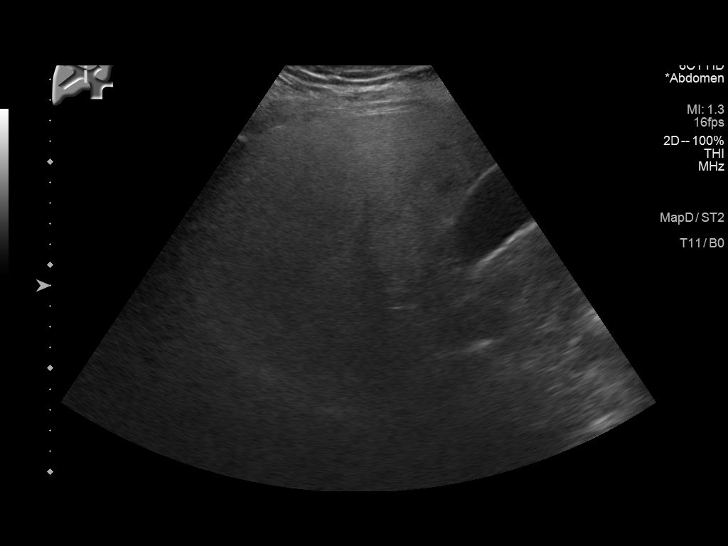
[im 25/40]
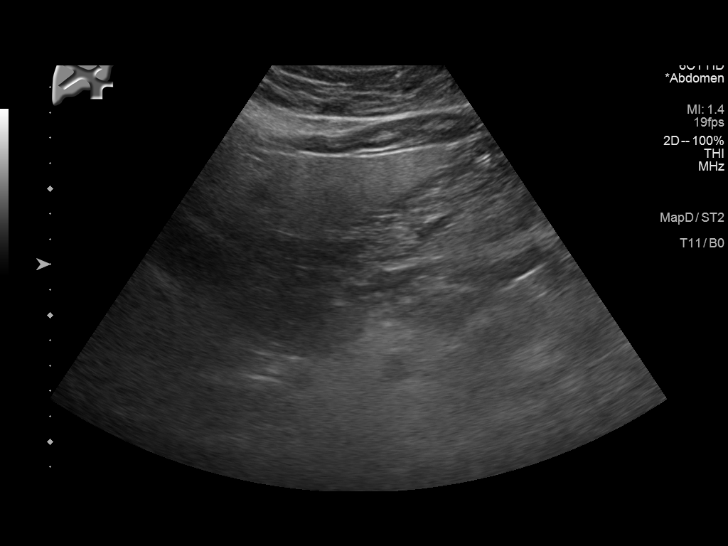
[im 27/40]
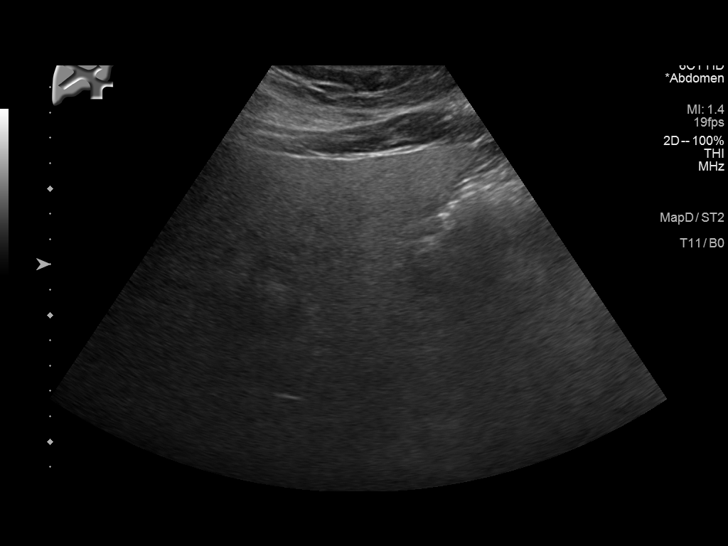
[im 30/40]
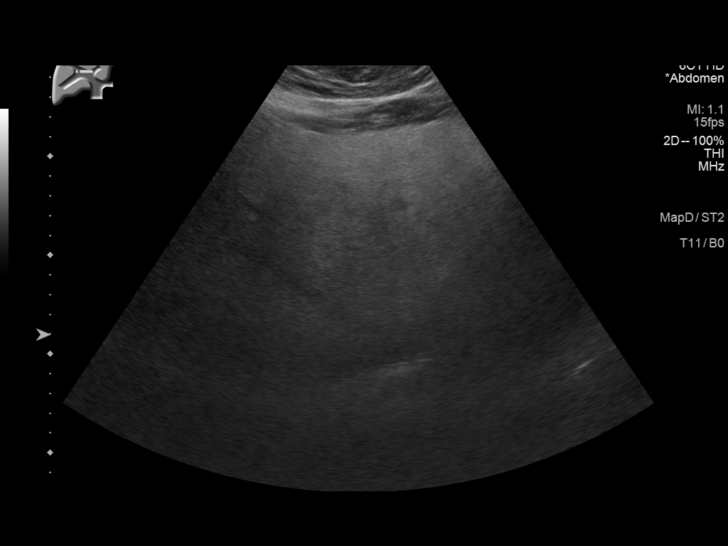
[im 33/40]
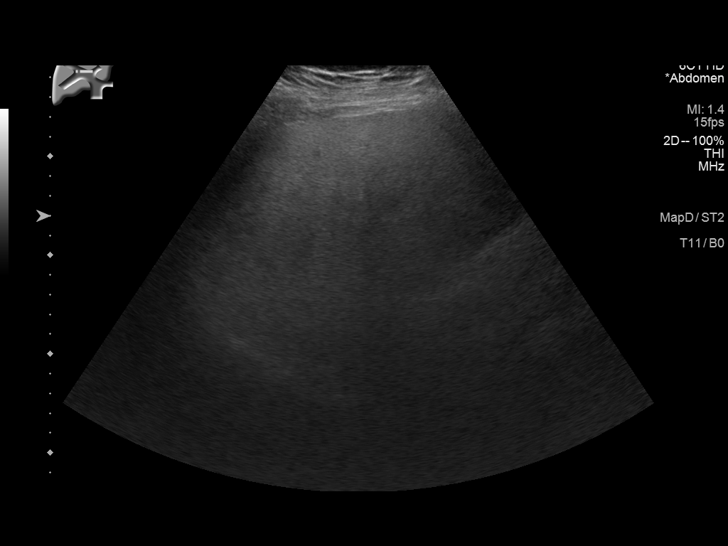
[im 36/40]
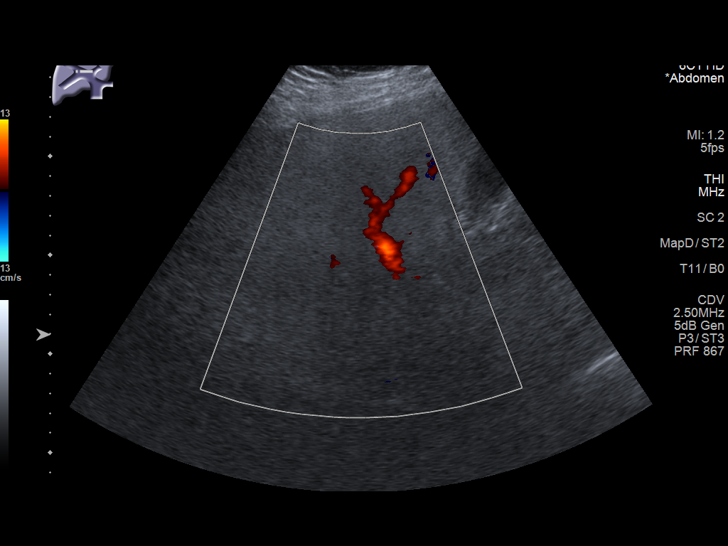
[im 40/40]
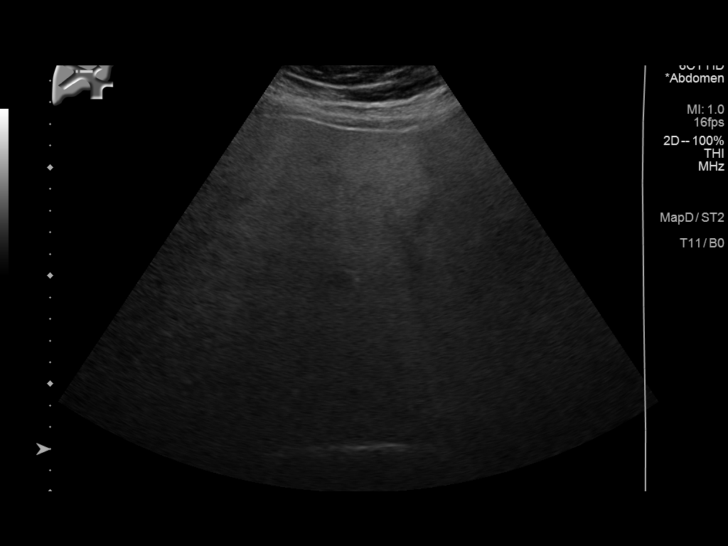

[14 of 25 positions shown; findings below may reference images not displayed]

FINDINGS: Gallbladder:

Gallstone in the dependent gallbladder. No wall thickening
visualized. No sonographic Murphy sign noted by sonographer.

Common bile duct:

Diameter: 2 mm

Liver:

No focal lesion identified. Increased parenchymal echogenicity.
Portal vein is patent on color Doppler imaging with normal direction
of blood flow towards the liver.

Other: None.
IMPRESSION: 1. Increased parenchymal echogenicity in keeping with hepatic
steatosis. No overt stigmata of cirrhosis.

2. No focal liver lesion identified. Please note that multiphasic
contrast enhanced CT and MRI are the most sensitive tests for the
screening detection of hepatocellular carcinoma in the high risk
setting.

3.  Cholelithiasis without evidence of acute cholecystitis.

## 2021-09-25 ENCOUNTER — Inpatient Hospital Stay: Payer: BC Managed Care – PPO | Attending: Oncology

## 2021-09-25 DIAGNOSIS — Z8042 Family history of malignant neoplasm of prostate: Secondary | ICD-10-CM | POA: Diagnosis not present

## 2021-09-25 LAB — CBC WITH DIFFERENTIAL/PLATELET
Abs Immature Granulocytes: 0.03 10*3/uL (ref 0.00–0.07)
Basophils Absolute: 0.1 10*3/uL (ref 0.0–0.1)
Basophils Relative: 1 %
Eosinophils Absolute: 0.1 10*3/uL (ref 0.0–0.5)
Eosinophils Relative: 1 %
HCT: 45.2 % (ref 39.0–52.0)
Hemoglobin: 15.8 g/dL (ref 13.0–17.0)
Immature Granulocytes: 0 %
Lymphocytes Relative: 31 %
Lymphs Abs: 3 10*3/uL (ref 0.7–4.0)
MCH: 32.3 pg (ref 26.0–34.0)
MCHC: 35 g/dL (ref 30.0–36.0)
MCV: 92.4 fL (ref 80.0–100.0)
Monocytes Absolute: 0.9 10*3/uL (ref 0.1–1.0)
Monocytes Relative: 9 %
Neutro Abs: 5.6 10*3/uL (ref 1.7–7.7)
Neutrophils Relative %: 58 %
Platelets: 230 10*3/uL (ref 150–400)
RBC: 4.89 MIL/uL (ref 4.22–5.81)
RDW: 12.3 % (ref 11.5–15.5)
WBC: 9.8 10*3/uL (ref 4.0–10.5)
nRBC: 0 % (ref 0.0–0.2)

## 2021-09-25 LAB — COMPREHENSIVE METABOLIC PANEL
ALT: 38 U/L (ref 0–44)
AST: 26 U/L (ref 15–41)
Albumin: 4.1 g/dL (ref 3.5–5.0)
Alkaline Phosphatase: 58 U/L (ref 38–126)
Anion gap: 7 (ref 5–15)
BUN: 20 mg/dL (ref 6–20)
CO2: 28 mmol/L (ref 22–32)
Calcium: 8.8 mg/dL — ABNORMAL LOW (ref 8.9–10.3)
Chloride: 102 mmol/L (ref 98–111)
Creatinine, Ser: 0.99 mg/dL (ref 0.61–1.24)
GFR, Estimated: >60 mL/min (ref >60)
Glucose, Bld: 108 mg/dL — ABNORMAL HIGH (ref 70–99)
Potassium: 4 mmol/L (ref 3.5–5.1)
Sodium: 137 mmol/L (ref 135–145)
Total Bilirubin: 1.2 mg/dL (ref 0.3–1.2)
Total Protein: 7.4 g/dL (ref 6.5–8.1)

## 2021-09-25 LAB — IRON AND TIBC
Iron: 118 ug/dL (ref 45–182)
Saturation Ratios: 35 % (ref 17.9–39.5)
TIBC: 335 ug/dL (ref 250–450)
UIBC: 217 ug/dL

## 2021-09-25 LAB — FERRITIN: Ferritin: 191 ng/mL (ref 24–336)

## 2021-09-25 LAB — PSA: Prostatic Specific Antigen: 0.27 ng/mL (ref 0.00–4.00)

## 2021-09-28 ENCOUNTER — Encounter: Payer: Self-pay | Admitting: Oncology

## 2021-09-28 ENCOUNTER — Inpatient Hospital Stay: Payer: BC Managed Care – PPO | Admitting: Oncology

## 2021-09-28 DIAGNOSIS — Z1211 Encounter for screening for malignant neoplasm of colon: Secondary | ICD-10-CM

## 2021-09-28 DIAGNOSIS — Z8042 Family history of malignant neoplasm of prostate: Secondary | ICD-10-CM | POA: Diagnosis not present

## 2021-09-28 NOTE — Progress Notes (Signed)
Hematology/Oncology Consult note [facility]  Telephone:([phone] [phone]  Patient Care Team: Pcp, No as PCP - General Erby Pian, MD as Referring Physician (Specialist) Sindy Guadeloupe, MD as Consulting Physician (Oncology)   Name of the patient: Ryan Calderon  [id number]  [birth year]   Date of visit: 09/28/21  Diagnosis-heterozygosity for H63D  Chief complaint/ Reason for visit-routine follow-up for heterozygosity of H63D without clinical hemochromatosis  Heme/Onc history:  Patient is a 51 year old male who was diagnosed with single gene mutation for H63D back in 2019 when he had seen Dr. Rogue Bussing.  He had elevated ferritin in the 600s at that time and had to undergo phlebotomy.  At that time he was symptomatic with worsening fatigue and joint pains which improved after phlebotomy.  He has not required phlebotomy for over a year now and the plan was to keep his ferritin less than 200.  He subsequently transferred care to Dr. Mike Gip was also getting [facility] surveillance.  No known history of cirrhosis.  No family history of cirrhosis    Interval history-patient is doing well overall and denies any specific complaints at this time.  He drinks alcohol usually on weekends.  ECOG PS- 0    Review of systems- Review of Systems  Constitutional:  Negative for chills, fever, malaise/fatigue and weight loss.  HENT:  Negative for congestion, ear discharge and nosebleeds.   Eyes:  Negative for blurred vision.  Respiratory:  Negative for cough, hemoptysis, sputum production, shortness of breath and wheezing.   Cardiovascular:  Negative for chest pain, palpitations, orthopnea and claudication.  Gastrointestinal:  Negative for abdominal pain, blood in stool, constipation, diarrhea, heartburn, melena, nausea and vomiting.  Genitourinary:  Negative for dysuria, flank pain, frequency, hematuria and urgency.  Musculoskeletal:  Negative for back pain, joint  pain and myalgias.  Skin:  Negative for rash.  Neurological:  Negative for dizziness, tingling, focal weakness, seizures, weakness and headaches.  Endo/Heme/Allergies:  Does not bruise/bleed easily.  Psychiatric/Behavioral:  Negative for depression and suicidal ideas. The patient does not have insomnia.       Allergies  Allergen Reactions   Peanut-Containing Drug Products Other (See Comments)    Patient allergic to peanuts - states it make him unable to talk above a whisper.     Past Medical History:  Diagnosis Date   BCC (basal cell carcinoma of skin)    Fatty liver    Hereditary hemochromatosis    Hyperlipidemia    Pneumothorax    Complicated course, Dr. Delilah Shan followed     History reviewed. No pertinent surgical history.  Social History   Socioeconomic History   Marital status: Married    Spouse name: Not on file   Number of children: Not on file   Years of education: Not on file   Highest education level: Not on file  Occupational History   Not on file  Tobacco Use   Smoking status: Never   Smokeless tobacco: Current    Types: Snuff   Tobacco comments:    16yr with snuff  Substance and Sexual Activity   Alcohol use: Yes    Alcohol/week: 0.0 standard drinks    Comment: ocassional beer   Drug use: No   Sexual activity: Yes    Partners: Female  Other Topics Concern   Not on file  Social History Narrative   Married 1995   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  [employer].  Explosive ordinance disposal, forward air controller.   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     Social Determinants of Health   Financial Resource Strain: Not on file  Food Insecurity: Not on file  Transportation Needs: Not on file  Physical Activity: Not on file  Stress: Not on file  Social Connections: Not on file  Intimate Partner Violence: Not on file     Family History  Problem Relation Age of Onset   Prostate cancer Father 87   COPD Father        smoker   Diabetes Other    Liver disease Brother    Colon cancer Neg Hx      Current Outpatient Medications:    CALCIUM-MAGNESIUM-VITAMIN D PO, Take by mouth. (Patient not taking: Reported on 09/28/2021), Disp: , Rfl:    cholecalciferol (VITAMIN D3) 25 MCG (1000 UNIT) tablet, Take 1,000 Units by mouth daily. (Patient not taking: Reported on 09/28/2021), Disp: , Rfl:    DHA-EPA-VITAMIN E PO, Take by mouth. (Patient not taking: Reported on 09/28/2021), Disp: , Rfl:    Multiple Vitamin (MULTIVITAMIN) tablet, Take 1 tablet by mouth daily. (Patient not taking: Reported on 09/28/2021), Disp: , Rfl:   Physical exam:  Vitals:   09/28/21 1356  BP: (!) 131/91  Pulse: 77  Resp: 16  Temp: (!) 96.3 F (35.7 C)  SpO2: 99%  Weight: 268 lb 6.4 oz (121.7 kg)   Physical Exam Constitutional:      General: He is not in acute distress. Cardiovascular:     Rate and Rhythm: Normal rate and regular rhythm.     Heart sounds: Normal heart sounds.  Pulmonary:     Effort: Pulmonary effort is normal.     Breath sounds: Normal breath sounds.  Skin:    General: Skin is warm and dry.  Neurological:     Mental Status: He is alert and oriented to person, place, and time.        Latest Ref Rng & Units 09/25/2021    1:25 PM  CMP  Glucose 70 - 99 mg/dL 108    BUN 6 - 20 mg/dL 20    Creatinine 0.61 - 1.24 mg/dL 0.99    Sodium 135 - 145 mmol/L 137    Potassium 3.5 - 5.1 mmol/L 4.0    Chloride 98 - 111 mmol/L 102    CO2 22 - 32 mmol/L 28    Calcium 8.9 - 10.3 mg/dL 8.8    Total Protein 6.5 - 8.1 g/dL 7.4    Total Bilirubin 0.3 - 1.2 mg/dL 1.2    Alkaline Phos 38 - 126 U/L 58    AST 15 - 41 U/L 26    ALT 0 - 44 U/L 38        Latest Ref Rng & Units 09/25/2021    1:25 PM  CBC  WBC 4.0 - 10.5 K/uL 9.8    Hemoglobin 13.0 - 17.0 g/dL 15.8    Hematocrit 39.0 - 52.0 % 45.2    Platelets 150 - 400 K/uL  230      Assessment and plan- Patient is a 51 y.o. male with heterozygosity for H63D without any symptoms of clinically iron overload here for routine follow-up  CBC and CMP are within normal limits.  Ferritin levels are normal at 190.Since he has heterozygosity for H63D this does not require phlebotomy to keep ferritin less than  100.  He does not require phlebotomy at this time.  CBC CMP and ferritin in 6 months in 1 year and I will see him back in 1 year   Visit Diagnosis 1. Hereditary hemochromatosis ([place])   2. Encounter for screening colonoscopy      Dr. Randa Evens, MD, MPH [facility] at [facility] [phone] 09/28/2021 4:22 PM

## 2022-02-16 ENCOUNTER — Other Ambulatory Visit: Payer: Self-pay | Admitting: Nurse Practitioner

## 2022-04-02 ENCOUNTER — Inpatient Hospital Stay: Payer: BC Managed Care – PPO | Attending: Oncology

## 2022-04-02 ENCOUNTER — Ambulatory Visit: Payer: BC Managed Care – PPO | Admitting: Oncology

## 2022-04-02 LAB — CBC WITH DIFFERENTIAL/PLATELET
Abs Immature Granulocytes: 0.03 10*3/uL (ref 0.00–0.07)
Basophils Absolute: 0.1 10*3/uL (ref 0.0–0.1)
Basophils Relative: 1 %
Eosinophils Absolute: 0.1 10*3/uL (ref 0.0–0.5)
Eosinophils Relative: 1 %
HCT: 47.3 % (ref 39.0–52.0)
Hemoglobin: 16.2 g/dL (ref 13.0–17.0)
Immature Granulocytes: 0 %
Lymphocytes Relative: 27 %
Lymphs Abs: 2.5 10*3/uL (ref 0.7–4.0)
MCH: 31.8 pg (ref 26.0–34.0)
MCHC: 34.2 g/dL (ref 30.0–36.0)
MCV: 92.9 fL (ref 80.0–100.0)
Monocytes Absolute: 0.8 10*3/uL (ref 0.1–1.0)
Monocytes Relative: 8 %
Neutro Abs: 5.9 10*3/uL (ref 1.7–7.7)
Neutrophils Relative %: 63 %
Platelets: 242 10*3/uL (ref 150–400)
RBC: 5.09 MIL/uL (ref 4.22–5.81)
RDW: 12.2 % (ref 11.5–15.5)
WBC: 9.3 10*3/uL (ref 4.0–10.5)
nRBC: 0 % (ref 0.0–0.2)

## 2022-04-02 LAB — COMPREHENSIVE METABOLIC PANEL
ALT: 37 U/L (ref 0–44)
AST: 24 U/L (ref 15–41)
Albumin: 4.3 g/dL (ref 3.5–5.0)
Alkaline Phosphatase: 63 U/L (ref 38–126)
Anion gap: 8 (ref 5–15)
BUN: 17 mg/dL (ref 6–20)
CO2: 27 mmol/L (ref 22–32)
Calcium: 9.3 mg/dL (ref 8.9–10.3)
Chloride: 101 mmol/L (ref 98–111)
Creatinine, Ser: 0.91 mg/dL (ref 0.61–1.24)
GFR, Estimated: >60 mL/min (ref >60)
Glucose, Bld: 125 mg/dL — ABNORMAL HIGH (ref 70–99)
Potassium: 4.4 mmol/L (ref 3.5–5.1)
Sodium: 136 mmol/L (ref 135–145)
Total Bilirubin: 1.1 mg/dL (ref 0.3–1.2)
Total Protein: 7.7 g/dL (ref 6.5–8.1)

## 2022-04-02 LAB — FERRITIN: Ferritin: 228 ng/mL (ref 24–336)

## 2022-10-01 ENCOUNTER — Inpatient Hospital Stay: Payer: Self-pay

## 2022-10-04 ENCOUNTER — Other Ambulatory Visit: Payer: Self-pay

## 2022-10-04 ENCOUNTER — Other Ambulatory Visit: Payer: Self-pay | Admitting: *Deleted

## 2022-10-04 ENCOUNTER — Inpatient Hospital Stay: Payer: 59 | Attending: Oncology

## 2022-10-04 ENCOUNTER — Ambulatory Visit: Payer: BC Managed Care – PPO | Admitting: Oncology

## 2022-10-04 DIAGNOSIS — Z125 Encounter for screening for malignant neoplasm of prostate: Secondary | ICD-10-CM

## 2022-10-04 DIAGNOSIS — K760 Fatty (change of) liver, not elsewhere classified: Secondary | ICD-10-CM

## 2022-10-04 LAB — COMPREHENSIVE METABOLIC PANEL
ALT: 30 U/L (ref 0–44)
AST: 22 U/L (ref 15–41)
Albumin: 3.8 g/dL (ref 3.5–5.0)
Alkaline Phosphatase: 60 U/L (ref 38–126)
Anion gap: 9 (ref 5–15)
BUN: 17 mg/dL (ref 6–20)
CO2: 24 mmol/L (ref 22–32)
Calcium: 8.5 mg/dL — ABNORMAL LOW (ref 8.9–10.3)
Chloride: 102 mmol/L (ref 98–111)
Creatinine, Ser: 0.81 mg/dL (ref 0.61–1.24)
GFR, Estimated: >60 mL/min (ref >60)
Glucose, Bld: 165 mg/dL — ABNORMAL HIGH (ref 70–99)
Potassium: 3.8 mmol/L (ref 3.5–5.1)
Sodium: 135 mmol/L (ref 135–145)
Total Bilirubin: 0.9 mg/dL (ref 0.3–1.2)
Total Protein: 6.8 g/dL (ref 6.5–8.1)

## 2022-10-04 LAB — CBC
HCT: 42.5 % (ref 39.0–52.0)
Hemoglobin: 14.6 g/dL (ref 13.0–17.0)
MCH: 31.9 pg (ref 26.0–34.0)
MCHC: 34.4 g/dL (ref 30.0–36.0)
MCV: 93 fL (ref 80.0–100.0)
Platelets: 228 10*3/uL (ref 150–400)
RBC: 4.57 MIL/uL (ref 4.22–5.81)
RDW: 12.2 % (ref 11.5–15.5)
WBC: 8.1 10*3/uL (ref 4.0–10.5)
nRBC: 0 % (ref 0.0–0.2)

## 2022-10-04 LAB — FERRITIN: Ferritin: 187 ng/mL (ref 24–336)

## 2022-10-11 ENCOUNTER — Inpatient Hospital Stay: Payer: 59 | Attending: Oncology | Admitting: Oncology

## 2022-10-11 ENCOUNTER — Encounter: Payer: Self-pay | Admitting: Oncology

## 2022-10-11 DIAGNOSIS — Z1211 Encounter for screening for malignant neoplasm of colon: Secondary | ICD-10-CM

## 2022-10-11 NOTE — Progress Notes (Signed)
Hematology/Oncology Consult note [facility]  Telephone:([phone] [phone]  Patient Care Team: Pcp, No as PCP - General Ryan Moores, MD as Referring Physician (Specialist) Creig Hines, MD as Consulting Physician (Oncology)   Name of the patient: Ryan Calderon  [id number]  08-Oct-1970   Date of visit: 10/11/22  Diagnosis-heterozygosity for H63D  Chief complaint/ Reason for visit-routine follow-up for H63D carrier state without overt hemochromatosis  Heme/Onc history: Patient is a 52 year old male who was diagnosed with single gene mutation for H63D back in 2019 when he had seen Dr. Donneta Calderon.  He had elevated ferritin in the 600s at that time and had to undergo phlebotomy.  At that time he was symptomatic with worsening fatigue and joint pains which improved after phlebotomy.  He has not required phlebotomy for over a year now and the plan was to keep his ferritin less than 200.  He subsequently transferred care to Dr. Merlene Calderon was also getting [facility] surveillance.  No known history of cirrhosis.  No family history of cirrhosis    Interval history-patient is doing well overall.  He does drink about 2-3 alcoholic drinks over the weekends.  Denies any recent changes in his health  ECOG PS- 0 Pain scale- 0  Review of systems- Review of Systems  Constitutional:  Negative for chills, fever, malaise/fatigue and weight loss.  HENT:  Negative for congestion, ear discharge and nosebleeds.   Eyes:  Negative for blurred vision.  Respiratory:  Negative for cough, hemoptysis, sputum production, shortness of breath and wheezing.   Cardiovascular:  Negative for chest pain, palpitations, orthopnea and claudication.  Gastrointestinal:  Negative for abdominal pain, blood in stool, constipation, diarrhea, heartburn, melena, nausea and vomiting.  Genitourinary:  Negative for dysuria, flank pain, frequency, hematuria and urgency.  Musculoskeletal:  Negative for  back pain, joint pain and myalgias.  Skin:  Negative for rash.  Neurological:  Negative for dizziness, tingling, focal weakness, seizures, weakness and headaches.  Endo/Heme/Allergies:  Does not bruise/bleed easily.  Psychiatric/Behavioral:  Negative for depression and suicidal ideas. The patient does not have insomnia.       Allergies  Allergen Reactions   Peanut-Containing Drug Products Other (See Comments)    Patient allergic to peanuts - states it make him unable to talk above a whisper.     Past Medical History:  Diagnosis Date   BCC (basal cell carcinoma of skin)    Fatty liver    Hereditary hemochromatosis    Hyperlipidemia    Pneumothorax    Complicated course, Dr. Adonis Calderon followed     History reviewed. No pertinent surgical history.  Social History   Socioeconomic History   Marital status: Married    Spouse name: Not on file   Number of children: Not on file   Years of education: Not on file   Highest education level: Not on file  Occupational History   Not on file  Tobacco Use   Smoking status: Never   Smokeless tobacco: Current    Types: Snuff   Tobacco comments:    22yrs with snuff  Substance and Sexual Activity   Alcohol use: Yes    Alcohol/week: 0.0 standard drinks of alcohol    Comment: ocassional beer   Drug use: No   Sexual activity: Yes    Partners: Female  Other Topics Concern   Not on file  Social History Narrative   Married 1995   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  [employer].  Explosive ordinance disposal, forward air controller.   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     Social Determinants of Health   Financial Resource Strain: Not on file  Food Insecurity: Not on file  Transportation Needs: Not on file  Physical Activity: Not on file  Stress: Not on file  Social Connections: Not on file  Intimate  Partner Violence: Not on file    Family History  Problem Relation Age of Onset   Prostate cancer Father 57   COPD Father        smoker   Diabetes Other    Liver disease Brother    Colon cancer Neg Hx     No current outpatient medications on file.  Physical exam:  Vitals:   10/11/22 0950  BP: 121/85  Pulse: 81  Resp: 18  Temp: 98.7 F (37.1 C)  TempSrc: Tympanic  SpO2: 98%  Weight: 256 lb 3.2 oz (116.2 kg)   Physical Exam Cardiovascular:     Rate and Rhythm: Normal rate and regular rhythm.     Heart sounds: Normal heart sounds.  Pulmonary:     Effort: Pulmonary effort is normal.     Breath sounds: Normal breath sounds.  Abdominal:     General: Bowel sounds are normal.     Palpations: Abdomen is soft.  Skin:    General: Skin is warm and dry.  Neurological:     Mental Status: He is alert and oriented to person, place, and time.         Latest Ref Rng & Units 10/04/2022    1:44 PM  CMP  Glucose 70 - 99 mg/dL 540   BUN 6 - 20 mg/dL 17   Creatinine 9.81 - 1.24 mg/dL 1.91   Sodium 478 - 295 mmol/L 135   Potassium 3.5 - 5.1 mmol/L 3.8   Chloride 98 - 111 mmol/L 102   CO2 22 - 32 mmol/L 24   Calcium 8.9 - 10.3 mg/dL 8.5   Total Protein 6.5 - 8.1 g/dL 6.8   Total Bilirubin 0.3 - 1.2 mg/dL 0.9   Alkaline Phos 38 - 126 U/L 60   AST 15 - 41 U/L 22   ALT 0 - 44 U/L 30       Latest Ref Rng & Units 10/04/2022    1:44 PM  CBC  WBC 4.0 - 10.5 K/uL 8.1   Hemoglobin 13.0 - 17.0 g/dL 62.1   Hematocrit 30.8 - 52.0 % 42.5   Platelets 150 - 400 K/uL 228      Assessment and plan- Patient is a 52 y.o. male with heterozygosity for H63D without any symptoms of clinically iron overload.  He is here for routine follow-up  CBC and CMP are within normal limits.  Ferritin is normal.  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  Patient still try to cut down on his alcohol intake.  Patient does not have any baseline  cirrhosis and therefore does not require HCC surveillance.  CBC with differential CMP ferritin and iron studies in 1 year and I will see him thereafter   Visit Diagnosis 1. Hereditary hemochromatosis (HCC)   2. Encounter for screening colonoscopy      Dr. Owens Shark, MD, MPH [facility] at [facility] [phone] 10/11/2022 1:11 PM

## 2022-10-17 ENCOUNTER — Telehealth: Payer: Self-pay | Admitting: *Deleted

## 2022-10-17 ENCOUNTER — Other Ambulatory Visit: Payer: Self-pay | Admitting: *Deleted

## 2022-10-17 DIAGNOSIS — Z1211 Encounter for screening for malignant neoplasm of colon: Secondary | ICD-10-CM

## 2022-10-17 MED ORDER — NA SULFATE-K SULFATE-MG SULF 17.5-3.13-1.6 GM/177ML PO SOLN: 1.0000 | Freq: Once | ORAL | 0 refills | Status: AC

## 2022-10-17 NOTE — Telephone Encounter (Signed)
Gastroenterology Pre-Procedure Review  Request Date: 11/14/2022 Requesting Physician: Dr. Servando Snare  PATIENT REVIEW QUESTIONS: The patient responded to the following health history questions as indicated:    1. Are you having any GI issues? no 2. Do you have a personal history of Polyps? no 3. Do you have a family history of Colon Cancer or Polyps? no 4. Diabetes Mellitus? no 5. Joint replacements in the past 12 months?no 6. Major health problems in the past 3 months?no 7. Any artificial heart valves, MVP, or defibrillator?no    MEDICATIONS & ALLERGIES:    Patient reports the following regarding taking any anticoagulation/antiplatelet therapy:   Plavix, Coumadin, Eliquis, Xarelto, Lovenox, Pradaxa, Brilinta, or Effient? no Aspirin? no  Patient confirms/reports the following medications:  Current Outpatient Medications  Medication Sig Dispense Refill   Na Sulfate-K Sulfate-Mg Sulf 17.5-3.13-1.6 GM/177ML SOLN Take 1 kit by mouth once for 1 dose. 354 mL 0   No current facility-administered medications for this visit.    Patient confirms/reports the following allergies:  Allergies  Allergen Reactions   Peanut-Containing Drug Products Other (See Comments)    Patient allergic to peanuts - states it make him unable to talk above a whisper.    No orders of the defined types were placed in this encounter.   AUTHORIZATION INFORMATION Primary Insurance: 1D#: Group #:  Secondary Insurance: 1D#: Group #:  SCHEDULE INFORMATION: Date: 11/14/2022 Time: Location: [facility]

## 2022-11-01 ENCOUNTER — Telehealth: Payer: Self-pay

## 2022-11-01 NOTE — Telephone Encounter (Signed)
Dr. Darrick Huntsman agreed to see Ryan Calderon as a new patient.  Confirmation is in the check-out note for his wife, [name] on 11/01/2022.

## 2022-11-05 ENCOUNTER — Encounter: Payer: Self-pay | Admitting: Gastroenterology

## 2022-11-05 NOTE — Anesthesia Preprocedure Evaluation (Addendum)
Anesthesia Evaluation  Patient identified by MRN, date of birth, ID band Patient awake    Reviewed: Allergy & Precautions, H&P , NPO status , Patient's Chart, lab work & pertinent test results  Airway Mallampati: III  TM Distance: >3 FB Neck ROM: Full    Dental no notable dental hx.    Pulmonary neg pulmonary ROS   Pulmonary exam normal breath sounds clear to auscultation       Cardiovascular negative cardio ROS Normal cardiovascular exam Rhythm:Regular Rate:Normal     Neuro/Psych negative neurological ROS  negative psych ROS   GI/Hepatic negative GI ROS, Neg liver ROS,,,  Endo/Other  negative endocrine ROS    Renal/GU negative Renal ROS  negative genitourinary   Musculoskeletal negative musculoskeletal ROS (+)    Abdominal   Peds negative pediatric ROS (+)  Hematology negative hematology ROS (+)   Anesthesia Other Findings Pneumothorax  Hyperlipidemia Hereditary hemochromatosis  BCC (basal cell carcinoma of skin) Fatty liver     Reproductive/Obstetrics negative OB ROS                             Anesthesia Physical Anesthesia Plan  ASA: 2  Anesthesia Plan: General   Post-op Pain Management:    Induction: Intravenous  PONV Risk Score and Plan:   Airway Management Planned: Natural Airway and Nasal Cannula  Additional Equipment:   Intra-op Plan:   Post-operative Plan:   Informed Consent: I have reviewed the patients History and Physical, chart, labs and discussed the procedure including the risks, benefits and alternatives for the proposed anesthesia with the patient or authorized representative who has indicated his/her understanding and acceptance.     Dental Advisory Given  Plan Discussed with: Anesthesiologist, CRNA and Surgeon  Anesthesia Plan Comments: (Patient consented for risks of anesthesia including but not limited to:  - adverse reactions to  medications - risk of airway placement if required - damage to eyes, teeth, lips or other oral mucosa - nerve damage due to positioning  - sore throat or hoarseness - Damage to heart, brain, nerves, lungs, other parts of body or loss of life  Patient voiced understanding.)        Anesthesia Quick Evaluation

## 2022-11-12 NOTE — Telephone Encounter (Signed)
noted 

## 2022-11-12 NOTE — Telephone Encounter (Signed)
Pt called in for his appt with [facility] as a new pt. Pt its scheduled for 01/31/23.

## 2022-11-14 ENCOUNTER — Encounter: Payer: Self-pay | Admitting: Gastroenterology

## 2022-11-14 ENCOUNTER — Ambulatory Visit
Admission: RE | Admit: 2022-11-14 | Discharge: 2022-11-14 | Disposition: A | Discharge status: Home / Self Care | Payer: 59 | Attending: Gastroenterology | Admitting: Gastroenterology

## 2022-11-14 ENCOUNTER — Ambulatory Visit: Payer: 59 | Admitting: Anesthesiology

## 2022-11-14 ENCOUNTER — Encounter
Admission: RE | Disposition: A | Discharge status: Home / Self Care | Payer: Self-pay | Source: Home / Self Care | Attending: Gastroenterology

## 2022-11-14 ENCOUNTER — Ambulatory Visit: Payer: Self-pay | Admitting: Anesthesiology

## 2022-11-14 ENCOUNTER — Other Ambulatory Visit: Payer: Self-pay

## 2022-11-14 DIAGNOSIS — K5730 Diverticulosis of large intestine without perforation or abscess without bleeding: Secondary | ICD-10-CM | POA: Insufficient documentation

## 2022-11-14 DIAGNOSIS — D125 Benign neoplasm of sigmoid colon: Secondary | ICD-10-CM | POA: Insufficient documentation

## 2022-11-14 DIAGNOSIS — D120 Benign neoplasm of cecum: Secondary | ICD-10-CM | POA: Insufficient documentation

## 2022-11-14 DIAGNOSIS — D124 Benign neoplasm of descending colon: Secondary | ICD-10-CM | POA: Insufficient documentation

## 2022-11-14 DIAGNOSIS — Z1211 Encounter for screening for malignant neoplasm of colon: Secondary | ICD-10-CM

## 2022-11-14 DIAGNOSIS — K635 Polyp of colon: Secondary | ICD-10-CM | POA: Diagnosis not present

## 2022-11-14 DIAGNOSIS — D122 Benign neoplasm of ascending colon: Secondary | ICD-10-CM | POA: Diagnosis not present

## 2022-11-14 DIAGNOSIS — K621 Rectal polyp: Secondary | ICD-10-CM | POA: Diagnosis not present

## 2022-11-14 HISTORY — PX: POLYPECTOMY: SHX5525

## 2022-11-14 HISTORY — PX: COLONOSCOPY WITH PROPOFOL: SHX5780

## 2022-11-14 SURGERY — COLONOSCOPY WITH PROPOFOL
Anesthesia: General | Site: Rectum

## 2022-11-14 MED ORDER — PROPOFOL 500 MG/50ML IV EMUL
INTRAVENOUS | Status: DC | PRN
  Administered 2022-11-14: 180 ug/kg/min via INTRAVENOUS

## 2022-11-14 MED ORDER — PROPOFOL 10 MG/ML IV BOLUS
INTRAVENOUS | Status: DC | PRN
  Administered 2022-11-14: 150 mg via INTRAVENOUS

## 2022-11-14 MED ORDER — LACTATED RINGERS IV SOLN: INTRAVENOUS | Status: DC

## 2022-11-14 MED ORDER — SODIUM CHLORIDE 0.9 % IV SOLN: INTRAVENOUS | Status: DC

## 2022-11-14 MED ORDER — LIDOCAINE HCL (CARDIAC) PF 100 MG/5ML IV SOSY
PREFILLED_SYRINGE | INTRAVENOUS | Status: DC | PRN
  Administered 2022-11-14: 100 mg via INTRAVENOUS

## 2022-11-14 MED ORDER — STERILE WATER FOR IRRIGATION IR SOLN
Status: DC | PRN
  Administered 2022-11-14: 1

## 2022-11-14 SURGICAL SUPPLY — 22 items
CLIP HMST 235XBRD CATH ROT (MISCELLANEOUS) IMPLANT
CLIP RESOLUTION 360 11X235 (MISCELLANEOUS) ×2
ELECT REM PT RETURN 9FT ADLT (ELECTROSURGICAL)
ELECTRODE REM PT RTRN 9FT ADLT (ELECTROSURGICAL) IMPLANT
FORCEPS BIOP RAD 4 LRG CAP 4 (CUTTING FORCEPS) IMPLANT
GOWN CVR UNV OPN BCK APRN NK (MISCELLANEOUS) ×4 IMPLANT
GOWN ISOL THUMB LOOP REG UNIV (MISCELLANEOUS) ×4
INJECTOR VARIJECT VIN23 (MISCELLANEOUS) IMPLANT
KIT DEFENDO VALVE AND CONN (KITS) IMPLANT
KIT PRC NS LF DISP ENDO (KITS) ×2 IMPLANT
KIT PROCEDURE OLYMPUS (KITS) ×2
MANIFOLD NEPTUNE II (INSTRUMENTS) ×2 IMPLANT
MARKER SPOT ENDO TATTOO 5ML (MISCELLANEOUS) IMPLANT
PROBE APC STR FIRE (PROBE) IMPLANT
RETRIEVER NET ROTH 2.5X230 LF (MISCELLANEOUS) IMPLANT
SNARE COLD EXACTO (MISCELLANEOUS) IMPLANT
SNARE LASSO HEX 3 IN 1 (INSTRUMENTS) IMPLANT
SNARE SHORT THROW 13M SML OVAL (MISCELLANEOUS) IMPLANT
SNARE SNG USE RND 15MM (INSTRUMENTS) IMPLANT
TRAP ETRAP POLY (MISCELLANEOUS) IMPLANT
VARIJECT INJECTOR VIN23 (MISCELLANEOUS)
WATER STERILE IRR 250ML POUR (IV SOLUTION) ×2 IMPLANT

## 2022-11-14 NOTE — Transfer of Care (Signed)
Immediate Anesthesia Transfer of Care Note  Patient: Ryan Calderon  Procedure(s) Performed: COLONOSCOPY WITH PROPOFOL (Rectum) POLYPECTOMY (Rectum)  Patient Location: PACU  Anesthesia Type:General  Level of Consciousness: awake, alert , and oriented  Airway & Oxygen Therapy: Patient Spontanous Breathing  Post-op Assessment: Report given to RN  Post vital signs: Reviewed and stable  Last Vitals: See PACU flow sheet All V/S wnl and seen by CRNA Vitals Value Taken Time  BP 110/82 11/14/22 1010  Temp    Pulse 81 11/14/22 1010  Resp 13 11/14/22 1011  SpO2 96 % 11/14/22 1010  Vitals shown include unfiled device data.  Last Pain:  Vitals:   11/14/22 0905  TempSrc: Temporal  PainSc: 0-No pain         Complications: No notable events documented.

## 2022-11-14 NOTE — Anesthesia Postprocedure Evaluation (Signed)
Anesthesia Post Note  Patient: Ryan Calderon  Procedure(s) Performed: COLONOSCOPY WITH PROPOFOL (Rectum) POLYPECTOMY (Rectum)  Patient location during evaluation: PACU Anesthesia Type: General Level of consciousness: awake and alert Pain management: pain level controlled Vital Signs Assessment: post-procedure vital signs reviewed and stable Respiratory status: spontaneous breathing, nonlabored ventilation, respiratory function stable and patient connected to nasal cannula oxygen Cardiovascular status: blood pressure returned to baseline and stable Postop Assessment: no apparent nausea or vomiting Anesthetic complications: no   No notable events documented.   Last Vitals:  Vitals:   11/14/22 1018 11/14/22 1019  BP:  122/87  Pulse:  72  Resp:  16  Temp: (!) 36.4 C (!) 36.4 C  SpO2:  99%    Last Pain:  Vitals:   11/14/22 1019  TempSrc:   PainSc: 0-No pain                 Schneur Crowson C Madason Rauls

## 2022-11-14 NOTE — Op Note (Signed)
[facility] Gastroenterology Patient Name: Ryan Calderon Procedure Date: 11/14/2022 9:40 AM MRN: [id number] Account #: [id number] Date of Birth: 1970-06-28 Admit Type: Outpatient Age: 52 Room: [facility] OR ROOM 01 Gender: Male Note Status: Finalized Instrument Name: [id number] Procedure:             Colonoscopy Indications:           Screening for colorectal malignant neoplasm Providers:             Midge Minium MD, MD Referring MD:          Midge Minium MD, MD (Referring MD), Duncan Dull, MD                         (Referring MD) Medicines:             Propofol per Anesthesia Complications:         No immediate complications. Procedure:             Pre-Anesthesia Assessment:                        - Prior to the procedure, a History and Physical was                         performed, and patient medications and allergies were                         reviewed. The patient's tolerance of previous                         anesthesia was also reviewed. The risks and benefits                         of the procedure and the sedation options and risks                         were discussed with the patient. All questions were                         answered, and informed consent was obtained. Prior                         Anticoagulants: The patient has taken no anticoagulant                         or antiplatelet agents. ASA Grade Assessment: II - A                         patient with mild systemic disease. After reviewing                         the risks and benefits, the patient was deemed in                         satisfactory condition to undergo the procedure.                        After obtaining informed consent, the colonoscope was  passed under direct vision. Throughout the procedure,                         the patient's blood pressure, pulse, and oxygen                         saturations were monitored continuously. The                          Colonoscope was introduced through the anus and                         advanced to the the cecum, identified by appendiceal                         orifice and ileocecal valve. The colonoscopy was                         performed without difficulty. The patient tolerated                         the procedure well. The quality of the bowel                         preparation was excellent. Findings:      The perianal and digital rectal examinations were normal.      A 20 mm polyp was found in the sigmoid colon. The polyp was       pedunculated. The polyp was removed with a hot snare. Resection and       retrieval were complete. To prevent bleeding post-intervention, two       hemostatic clips were successfully placed (MR conditional). Clip       manufacturer: [employer]. There was no bleeding at the end of the       procedure.      Two sessile polyps were found in the descending colon. The polyps were 2       to 5 mm in size. These polyps were removed with a cold snare. Resection       and retrieval were complete.      A 3 mm polyp was found in the cecum. The polyp was sessile. The polyp       was removed with a cold snare. Resection and retrieval were complete.      Three sessile polyps were found in the ascending colon. The polyps were       3 to 5 mm in size. These polyps were removed with a cold snare.       Resection and retrieval were complete.      Multiple small-mouthed diverticula were found in the sigmoid colon. Impression:            - One 20 mm polyp in the sigmoid colon, removed with a                         hot snare. Resected and retrieved. Clips (MR                         conditional) were placed. Clip manufacturer: [employer].                        - Two 2 to 5 mm polyps in the descending colon,                         removed with a cold snare. Resected and retrieved.                        - One 3 mm polyp in the cecum,  removed with a cold                         snare. Resected and retrieved.                        - Three 3 to 5 mm polyps in the ascending colon,                         removed with a cold snare. Resected and retrieved.                        - Diverticulosis in the sigmoid colon. Recommendation:        - Discharge patient to home.                        - Resume previous diet.                        - Continue present medications.                        - Await pathology results.                        - If the pathology report reveals adenomatous tissue,                         then repeat the colonoscopy for surveillance in 1 year. Procedure Code(s):     --- Professional ---                        [phone], Colonoscopy, flexible; with removal of                         tumor(s), polyp(s), or other lesion(s) by snare                         technique Diagnosis Code(s):     --- Professional ---                        Z12.11, Encounter for screening for malignant neoplasm                         of colon                        D12.5, Benign neoplasm of sigmoid colon CPT copyright 2022 American Medical Association. All rights reserved. The codes documented in this report are preliminary and upon coder review may  be revised to meet current  compliance requirements. Midge Minium MD, MD 11/14/2022 10:10:04 AM This report has been signed electronically. Number of Addenda: 0 Note Initiated On: 11/14/2022 9:40 AM Scope Withdrawal Time: 0 hours 12 minutes 15 seconds  Total Procedure Duration: 0 hours 16 minutes 46 seconds  Estimated Blood Loss:  Estimated blood loss: none. Estimated blood loss: none.      [facility]

## 2022-11-14 NOTE — H&P (Signed)
   Midge Minium, MD [facility] [address], [address] [place], [place] [address] Phone: [phone] Fax : [phone]  Primary Care Physician:  Sherlene Shams, MD Primary Gastroenterologist:  Dr. Servando Snare  Pre-Procedure History & Physical: HPI:  Ryan Calderon is a 52 y.o. male is here for a screening colonoscopy.   Past Medical History:  Diagnosis Date   BCC (basal cell carcinoma of skin)    Fatty liver    Hereditary hemochromatosis    Hyperlipidemia    Pneumothorax    Complicated course, Dr. Adonis Huguenin followed    History reviewed. No pertinent surgical history.  Prior to Admission medications   Not on File    Allergies as of 10/17/2022 - Review Complete 10/11/2022  Allergen Reaction Noted   Peanut-containing drug products Other (See Comments) 09/19/2015    Family History  Problem Relation Age of Onset   Prostate cancer Father 58   COPD Father        smoker   Diabetes Other    Liver disease Brother    Colon cancer Neg Hx     Social History   Socioeconomic History   Marital status: Married    Spouse name: Not on file   Number of children: Not on file   Years of education: Not on file   Highest education level: Not on file  Occupational History   Not on file  Tobacco Use   Smoking status: Never   Smokeless tobacco: Current    Types: Snuff   Tobacco comments:    66yrs with snuff  Substance and Sexual Activity   Alcohol use: Yes    Alcohol/week: 0.0 standard drinks of alcohol    Comment: ocassional beer   Drug use: No   Sexual activity: Yes    Partners: Female  Other Topics Concern   Not on file  Social History Narrative   Married 1995   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  [employer].  Explosive ordinance disposal, forward air controller.   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     Social Determinants of Health   Financial Resource  Strain: Not on file  Food Insecurity: Not on file  Transportation Needs: Not on file  Physical Activity: Not on file  Stress: Not on file  Social Connections: Not on file  Intimate Partner Violence: Not on file    Review of Systems: See HPI, otherwise negative ROS  Physical Exam: BP 133/83   Temp 97.8 F (36.6 C) (Temporal)   Resp 12   Ht 6\' 1"  (1.854 m)   Wt 113.4 kg   SpO2 94%   BMI 32.97 kg/m  General:   Alert,  pleasant and cooperative in NAD Head:  Normocephalic and atraumatic. Neck:  Supple; no masses or thyromegaly. Lungs:  Clear throughout to auscultation.    Heart:  Regular rate and rhythm. Abdomen:  Soft, nontender and nondistended. Normal bowel sounds, without guarding, and without rebound.   Neurologic:  Alert and  oriented x4;  grossly normal neurologically.  Impression/Plan: Ryan Calderon is now here to undergo a screening colonoscopy.  Risks, benefits, and alternatives regarding colonoscopy have been reviewed with the patient.  Questions have been answered.  All parties agreeable.

## 2022-11-16 ENCOUNTER — Encounter: Payer: Self-pay | Admitting: Gastroenterology

## 2023-01-31 ENCOUNTER — Encounter: Payer: Self-pay | Admitting: Internal Medicine

## 2023-01-31 ENCOUNTER — Ambulatory Visit (INDEPENDENT_AMBULATORY_CARE_PROVIDER_SITE_OTHER): Payer: 59 | Admitting: Internal Medicine

## 2023-01-31 DIAGNOSIS — F520 Hypoactive sexual desire disorder: Secondary | ICD-10-CM | POA: Diagnosis not present

## 2023-01-31 DIAGNOSIS — R5383 Other fatigue: Secondary | ICD-10-CM | POA: Diagnosis not present

## 2023-01-31 DIAGNOSIS — I872 Venous insufficiency (chronic) (peripheral): Secondary | ICD-10-CM

## 2023-01-31 DIAGNOSIS — E785 Hyperlipidemia, unspecified: Secondary | ICD-10-CM

## 2023-01-31 DIAGNOSIS — Z1211 Encounter for screening for malignant neoplasm of colon: Secondary | ICD-10-CM

## 2023-01-31 DIAGNOSIS — Z125 Encounter for screening for malignant neoplasm of prostate: Secondary | ICD-10-CM

## 2023-01-31 DIAGNOSIS — E1169 Type 2 diabetes mellitus with other specified complication: Secondary | ICD-10-CM

## 2023-01-31 DIAGNOSIS — Z0000 Encounter for general adult medical examination without abnormal findings: Secondary | ICD-10-CM | POA: Diagnosis not present

## 2023-01-31 DIAGNOSIS — R7301 Impaired fasting glucose: Secondary | ICD-10-CM

## 2023-01-31 DIAGNOSIS — K760 Fatty (change of) liver, not elsewhere classified: Secondary | ICD-10-CM | POA: Diagnosis not present

## 2023-01-31 DIAGNOSIS — K8020 Calculus of gallbladder without cholecystitis without obstruction: Secondary | ICD-10-CM | POA: Diagnosis not present

## 2023-01-31 LAB — CBC WITH DIFFERENTIAL/PLATELET
Basophils Absolute: 0.1 10*3/uL (ref 0.0–0.1)
Basophils Relative: 0.6 % (ref 0.0–3.0)
Eosinophils Absolute: 0.1 10*3/uL (ref 0.0–0.7)
Eosinophils Relative: 1.2 % (ref 0.0–5.0)
HCT: 47.7 % (ref 39.0–52.0)
Hemoglobin: 15.6 g/dL (ref 13.0–17.0)
Lymphocytes Relative: 29.6 % (ref 12.0–46.0)
Lymphs Abs: 2.7 10*3/uL (ref 0.7–4.0)
MCHC: 32.6 g/dL (ref 30.0–36.0)
MCV: 94.8 fL (ref 78.0–100.0)
Monocytes Absolute: 0.9 10*3/uL (ref 0.1–1.0)
Monocytes Relative: 9.9 % (ref 3.0–12.0)
Neutro Abs: 5.3 10*3/uL (ref 1.4–7.7)
Neutrophils Relative %: 58.7 % (ref 43.0–77.0)
Platelets: 260 10*3/uL (ref 150.0–400.0)
RBC: 5.03 Mil/uL (ref 4.22–5.81)
RDW: 12.9 % (ref 11.5–15.5)
WBC: 9.1 10*3/uL (ref 4.0–10.5)

## 2023-01-31 LAB — COMPREHENSIVE METABOLIC PANEL
ALT: 27 U/L (ref 0–53)
AST: 18 U/L (ref 0–37)
Albumin: 4.5 g/dL (ref 3.5–5.2)
Alkaline Phosphatase: 63 U/L (ref 39–117)
BUN: 17 mg/dL (ref 6–23)
CO2: 31 meq/L (ref 19–32)
Calcium: 9.6 mg/dL (ref 8.4–10.5)
Chloride: 101 meq/L (ref 96–112)
Creatinine, Ser: 1.06 mg/dL (ref 0.40–1.50)
GFR: 80.95 mL/min (ref >60.00)
Glucose, Bld: 83 mg/dL (ref 70–99)
Potassium: 4.5 meq/L (ref 3.5–5.1)
Sodium: 140 meq/L (ref 135–145)
Total Bilirubin: 0.8 mg/dL (ref 0.2–1.2)
Total Protein: 7.2 g/dL (ref 6.0–8.3)

## 2023-01-31 LAB — LIPID PANEL
Cholesterol: 222 mg/dL — ABNORMAL HIGH (ref 0–200)
HDL: 41.7 mg/dL (ref >39.00)
LDL Cholesterol: 144 mg/dL — ABNORMAL HIGH (ref 0–99)
NonHDL: 180.48
Total CHOL/HDL Ratio: 5
Triglycerides: 181 mg/dL — ABNORMAL HIGH (ref 0.0–149.0)
VLDL: 36.2 mg/dL (ref 0.0–40.0)

## 2023-01-31 LAB — HEMOGLOBIN A1C: Hgb A1c MFr Bld: 6.7 % — ABNORMAL HIGH (ref 4.6–6.5)

## 2023-01-31 LAB — LDL CHOLESTEROL, DIRECT: Direct LDL: 164 mg/dL

## 2023-01-31 LAB — PSA: PSA: 0.43 ng/mL (ref 0.10–4.00)

## 2023-01-31 LAB — TSH: TSH: 1.61 u[IU]/mL (ref 0.35–5.50)

## 2023-01-31 NOTE — Assessment & Plan Note (Addendum)
One single gene mutation noted in 2019.  Surveilance currently by [name]:  last eval in June:  CBC and CMP are within normal limits.  Ferritin is normal.  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  Patient still try to cut down on his alcohol intake.  Patient does not have any baseline cirrhosis and therefore does not require HCC surveillance.  CBC with differential CMP ferritin and iron studies in 1 year and I will see him thereafter "

## 2023-01-31 NOTE — Assessment & Plan Note (Signed)
With prior evaluation in 2019 by [facility]  ,  advised to wear stockings.  He is not interested in wearing them. Because he doesn not have edema  just discomfort.

## 2023-01-31 NOTE — Assessment & Plan Note (Addendum)
Noted on 2021 abd ultrasound .  Reviewed recs for Hep A/B vaccination.  He was vaccinated during his [employer] service.  Checking titers.  Mediterranean diet reviewed

## 2023-01-31 NOTE — Assessment & Plan Note (Signed)
Asymptomatic, noted on 2021 ultrasound during workup for iron overload

## 2023-01-31 NOTE — Progress Notes (Unsigned)
Subjective:  Patient ID: Ryan Calderon, male    DOB: [birth year]  Age: 52 y.o. MRN: [id number]  CC: The primary encounter diagnosis was Hyperlipidemia, unspecified hyperlipidemia type. Diagnoses of Prostate cancer screening and Other fatigue were also pertinent to this visit.   HPI Ryan Calderon presents for  Chief Complaint  Patient presents with   Transfer of Care   Patient is a 52 yr old male with a remote history of pneumonia resulting in a  PTX,  history of elevated iron levels attributed to hereditary hemochromatosis which appears to have resolved,  history of Overweight,referred by his wife to establish care.    Cc:  Loss of libido.  No trouble with achieving or sustaining erections.  Business owner ,  rarely takes vacation,  marriage good .  2 sons now working for him  ,  s    No outpatient medications prior to visit.   No facility-administered medications prior to visit.    Review of Systems;  Patient denies headache, fevers, malaise, unintentional weight loss, skin rash, eye pain, sinus congestion and sinus pain, sore throat, dysphagia,  hemoptysis , cough, dyspnea, wheezing, chest pain, palpitations, orthopnea, edema, abdominal pain, nausea, melena, diarrhea, constipation, flank pain, dysuria, hematuria, urinary  Frequency, nocturia, numbness, tingling, seizures,  Focal weakness, Loss of consciousness,  Tremor, insomnia, depression, anxiety, and suicidal ideation.      Objective:  BP 120/78   Pulse 80   Ht 6\' 1"  (1.854 m)   Wt 260 lb 6.4 oz (118.1 kg)   SpO2 97%   BMI 34.36 kg/m   BP Readings from Last 3 Encounters:  01/31/23 120/78  11/14/22 122/87  10/11/22 121/85    Wt Readings from Last 3 Encounters:  01/31/23 260 lb 6.4 oz (118.1 kg)  11/14/22 249 lb 14.4 oz (113.4 kg)  10/11/22 256 lb 3.2 oz (116.2 kg)    Physical Exam Vitals reviewed.  Constitutional:      General: He is not in acute distress.    Appearance: Normal appearance. He is well-groomed  and overweight. He is not ill-appearing, toxic-appearing or diaphoretic.  HENT:     Head: Normocephalic.  Eyes:     General: No scleral icterus.       Right eye: No discharge.        Left eye: No discharge.     Conjunctiva/sclera: Conjunctivae normal.  Cardiovascular:     Rate and Rhythm: Normal rate and regular rhythm.     Heart sounds: Normal heart sounds.  Pulmonary:     Effort: Pulmonary effort is normal. No respiratory distress.     Breath sounds: Normal breath sounds.  Musculoskeletal:        General: Normal range of motion.     Cervical back: Normal range of motion.  Skin:    General: Skin is warm and dry.  Neurological:     General: No focal deficit present.     Mental Status: He is alert and oriented to person, place, and time. Mental status is at baseline.  Psychiatric:        Mood and Affect: Mood normal.        Behavior: Behavior normal.        Thought Content: Thought content normal.        Judgment: Judgment normal.   No results found for: "HGBA1C"  Lab Results  Component Value Date   CREATININE 0.81 10/04/2022   CREATININE 0.91 04/02/2022   CREATININE 0.99 09/25/2021  Lab Results  Component Value Date   WBC 8.1 10/04/2022   HGB 14.6 10/04/2022   HCT 42.5 10/04/2022   PLT 228 10/04/2022   GLUCOSE 165 (H) 10/04/2022   ALT 30 10/04/2022   AST 22 10/04/2022   NA 135 10/04/2022   K 3.8 10/04/2022   CL 102 10/04/2022   CREATININE 0.81 10/04/2022   BUN 17 10/04/2022   CO2 24 10/04/2022    No results found.  Assessment & Plan:  .Hyperlipidemia, unspecified hyperlipidemia type  Prostate cancer screening  Other fatigue     I provided 30 minutes of face-to-face time during this encounter reviewing patient's last visit with me, patient's  most recent visit with cardiology,  nephrology,  and neurology,  recent surgical and non surgical procedures, previous  labs and imaging studies, counseling on currently addressed issues,  and post visit  ordering to diagnostics and therapeutics .   Follow-up: No follow-ups on file.   Sherlene Shams, MD

## 2023-01-31 NOTE — Patient Instructions (Addendum)
It was nice meeting you!  Your labs will be resulted to your mychart in 1-2 days   If you have any deficiencies  we will address

## 2023-02-01 LAB — HEPATITIS A ANTIBODY, TOTAL: Hepatitis A AB,Total: NONREACTIVE

## 2023-02-01 LAB — HEPATITIS B SURFACE ANTIBODY,QUALITATIVE: Hep B S Ab: NONREACTIVE

## 2023-02-02 DIAGNOSIS — Z125 Encounter for screening for malignant neoplasm of prostate: Secondary | ICD-10-CM | POA: Insufficient documentation

## 2023-02-02 DIAGNOSIS — F520 Hypoactive sexual desire disorder: Secondary | ICD-10-CM | POA: Insufficient documentation

## 2023-02-02 DIAGNOSIS — E1169 Type 2 diabetes mellitus with other specified complication: Secondary | ICD-10-CM | POA: Insufficient documentation

## 2023-02-02 NOTE — Progress Notes (Signed)
Patient ID: SY SAINTJEAN, male    DOB: Oct 11, 1970  Age: 52 y.o. MRN: [id number]  The patient is here for annual preventive examination and transfer of care .   The risk factors are reflected in the social history.   The roster of all physicians providing medical care to patient - is listed in the Snapshot section of the chart.   Activities of daily living:  The patient is 100% independent in all ADLs: dressing, toileting, feeding as well as independent mobility   Home safety : The patient has smoke detectors in the home. They wear seatbelts.  There are no unsecured firearms at home. There is no violence in the home.    There is no risks for hepatitis, STDs or HIV. There is no   history of blood transfusion. They have no travel history to infectious disease endemic areas of the world.   The patient has seen their dentist in the last six month. They have seen their eye doctor in the last year. The patinet  denies slight hearing difficulty with regard to whispered voices and some television programs.  They have deferred audiologic testing in the last year.  They do not  have excessive sun exposure. Discussed the need for sun protection: hats, long sleeves and use of sunscreen if there is significant sun exposure.    Diet: the importance of a healthy diet is discussed. They do have a healthy diet.   The benefits of regular aerobic exercise were discussed. The patient  exercises  3 to 5 days per week  for  60 minutes.    Depression screen: there are no signs or vegative symptoms of depression- irritability, change in appetite, anhedonia, sadness/tearfullness.   The following portions of the patient's history were reviewed and updated as appropriate: allergies, current medications, past family history, past medical history,  past surgical history, past social history  and problem list.   Visual acuity was not assessed per patient preference since the patient has regular follow up with an   ophthalmologist. Hearing and body mass index were assessed and reviewed.    During the course of the visit the patient was educated and counseled about appropriate screening and preventive services including : fall prevention , diabetes screening, nutrition counseling, colorectal cancer screening, and recommended immunizations.    Chief Complaint:    Patient is a 52 yr old male with a remote history of pneumonia resulting in a  PTX,  history of elevated iron levels attributed to hereditary hemochromatosis which appears to have resolved,  history of Overweight,referred by his wife to establish care.    Cc:  Loss of libido.  No trouble with achieving or sustaining erections.  Business owner ,  rarely takes vacation,  marriage good .  2 sons now working for him  ,  s   Review of Symptoms  Patient denies headache, fevers, malaise, unintentional weight loss, skin rash, eye pain, sinus congestion and sinus pain, sore throat, dysphagia,  hemoptysis , cough, dyspnea, wheezing, chest pain, palpitations, orthopnea, edema, abdominal pain, nausea, melena, diarrhea, constipation, flank pain, dysuria, hematuria, urinary  Frequency, nocturia, numbness, tingling, seizures,  Focal weakness, Loss of consciousness,  Tremor, insomnia, depression, anxiety, and suicidal ideation.    Physical Exam:  BP 120/78   Pulse 80   Ht 6\' 1"  (1.854 m)   Wt 260 lb 6.4 oz (118.1 kg)   SpO2 97%   BMI 34.36 kg/m    Physical Exam Vitals reviewed.  Constitutional:  General: He is not in acute distress.    Appearance: Normal appearance. He is normal weight. He is not ill-appearing, toxic-appearing or diaphoretic.  HENT:     Head: Normocephalic and atraumatic.     Right Ear: Tympanic membrane, ear canal and external ear normal. There is no impacted cerumen.     Left Ear: Tympanic membrane, ear canal and external ear normal. There is no impacted cerumen.     Nose: Nose normal.     Mouth/Throat:     Mouth: Mucous  membranes are moist.     Pharynx: Oropharynx is clear.  Eyes:     General: No scleral icterus.       Right eye: No discharge.        Left eye: No discharge.     Conjunctiva/sclera: Conjunctivae normal.  Neck:     Thyroid: No thyromegaly.     Vascular: No carotid bruit or JVD.  Cardiovascular:     Rate and Rhythm: Normal rate and regular rhythm.     Heart sounds: Normal heart sounds.  Pulmonary:     Effort: Pulmonary effort is normal. No respiratory distress.     Breath sounds: Normal breath sounds.  Abdominal:     General: Bowel sounds are normal.     Palpations: Abdomen is soft. There is no mass.     Tenderness: There is no abdominal tenderness. There is no guarding or rebound.  Musculoskeletal:        General: Normal range of motion.     Cervical back: Normal range of motion and neck supple.  Lymphadenopathy:     Cervical: No cervical adenopathy.  Skin:    General: Skin is warm and dry.  Neurological:     General: No focal deficit present.     Mental Status: He is alert and oriented to person, place, and time. Mental status is at baseline.  Psychiatric:        Mood and Affect: Mood normal.        Behavior: Behavior normal.        Thought Content: Thought content normal.        Judgment: Judgment normal.    Assessment and Plan: Primary hereditary hemochromatosis (HCC) Assessment & Plan: One single gene mutation noted in 2019.  Surveilance currently by [name]:  last eval in June:  CBC and CMP are within normal limits.  Ferritin is normal.  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  Patient still try to cut down on his alcohol intake.  Patient does not have any baseline cirrhosis and therefore does not require HCC surveillance.  CBC with differential CMP ferritin and iron studies in 1 year and I will see him thereafter "   Hyperlipidemia, unspecified hyperlipidemia type -     Lipid panel -     LDL cholesterol,  direct -     Comprehensive metabolic panel -     Hemoglobin A1c -     CBC with Differential/Platelet  Prostate cancer screening Assessment & Plan: PSA ordered,  PSA and velocity are normal   Lab Results  Component Value Date   PSA 0.43 01/31/2023      Orders: -     PSA  Other fatigue -     CBC with Differential/Platelet -     TSH  Impaired fasting glucose  Hepatic steatosis Assessment & Plan: Noted on 2021 abd ultrasound .  Reviewed recs for Hep A/B vaccination.  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  Will recommend revaccination, metformin and statin given new onset diabetes.   Mediterranean diet reviewed  Orders: -     Hepatitis B surface antibody,qualitative -     Hepatitis A antibody, total  Lack of libido Assessment & Plan: Testosterone level is normal.  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  He is not interested in wearing them. Because he doesn not have edema  just discomfort.    Calculus of gallbladder without cholecystitis without obstruction Assessment & Plan: Asymptomatic, noted on 2021 ultrasound during workup for iron overload   Hyperlipidemia associated with type 2 diabetes mellitus (HCC) Assessment & Plan: New diagnosis of type 2 diabetes based on today's A1c.  Will recommend he return for counselling,  fruther evaluation and statin therapy.  Lab Results  Component Value Date   HGBA1C 6.7 (H) 01/31/2023   Lab Results  Component Value Date   CHOL 222 (H) 01/31/2023   HDL 41.70 01/31/2023   LDLCALC 144 (H) 01/31/2023   LDLDIRECT 164.0 01/31/2023   TRIG 181.0 (H) 01/31/2023   CHOLHDL 5 01/31/2023   No results found for: "LABMICR", "MICROALBUR"      Routine general medical examination at a health care  facility Assessment & Plan: age appropriate education and counseling updated, referrals for preventative services and immunizations addressed, dietary and smoking counseling addressed, most recent labs reviewed.  I have personally reviewed and have noted:   1) the patient's medical and social history 2) The pt's use of alcohol, tobacco, and illicit drugs 3) The patient's current medications and supplements 4) Functional ability including ADL's, fall risk, home safety risk, hearing and visual impairment 5) Diet and physical activities 6) Evidence for depression or mood disorder 7) The patient's height, weight, and BMI have been recorded in the chart     I have made referrals, and provided counseling and education based on review of the above    Colon cancer screening Assessment & Plan: 4 TA's without high grade dysplasia were removed from July 2024 colonoscopy.  1 yr follow up is advised.     No follow-ups on file.  Sherlene Shams, MD

## 2023-02-02 NOTE — Assessment & Plan Note (Addendum)
New diagnosis of type 2 diabetes based on today's A1c.  Will recommend he return for counselling,  fruther evaluation and statin therapy.  Lab Results  Component Value Date   HGBA1C 6.7 (H) 01/31/2023   Lab Results  Component Value Date   CHOL 222 (H) 01/31/2023   HDL 41.70 01/31/2023   LDLCALC 144 (H) 01/31/2023   LDLDIRECT 164.0 01/31/2023   TRIG 181.0 (H) 01/31/2023   CHOLHDL 5 01/31/2023   No results found for: "LABMICR", "MICROALBUR"

## 2023-02-02 NOTE — Assessment & Plan Note (Signed)
4 TA's without high grade dysplasia were removed from July 2024 colonoscopy.  1 yr follow up is advised.

## 2023-02-02 NOTE — Assessment & Plan Note (Signed)
Testosterone level is normal.  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

## 2023-02-02 NOTE — Assessment & Plan Note (Signed)
PSA ordered,  PSA and velocity are normal   Lab Results  Component Value Date   PSA 0.43 01/31/2023

## 2023-02-02 NOTE — Assessment & Plan Note (Signed)

## 2023-02-04 LAB — TESTOSTERONE,FREE AND TOTAL
Testosterone, Free: 3.8 pg/mL — ABNORMAL LOW (ref 7.2–24.0)
Testosterone: 392 ng/dL (ref 264–916)

## 2023-02-25 ENCOUNTER — Ambulatory Visit (INDEPENDENT_AMBULATORY_CARE_PROVIDER_SITE_OTHER): Payer: 59 | Admitting: Internal Medicine

## 2023-02-25 ENCOUNTER — Encounter: Payer: Self-pay | Admitting: Internal Medicine

## 2023-02-25 VITALS — BP 138/88 | HR 79 | Ht 73.0 in | Wt 258.6 lb

## 2023-02-25 DIAGNOSIS — E785 Hyperlipidemia, unspecified: Secondary | ICD-10-CM

## 2023-02-25 DIAGNOSIS — E119 Type 2 diabetes mellitus without complications: Secondary | ICD-10-CM | POA: Insufficient documentation

## 2023-02-25 DIAGNOSIS — E1169 Type 2 diabetes mellitus with other specified complication: Secondary | ICD-10-CM

## 2023-02-25 DIAGNOSIS — Z23 Encounter for immunization: Secondary | ICD-10-CM

## 2023-02-25 MED ORDER — ROSUVASTATIN CALCIUM 5 MG PO TABS: 5.0000 mg | ORAL_TABLET | Freq: Every day | ORAL | 1 refills | Status: DC

## 2023-02-25 NOTE — Patient Instructions (Signed)
Your diabetes may have been caused by your iron overload syndrome.  However it still increases your risk for heat disease, so I recommend starting generic Crestor to 1) lower   your LDL and 2) protect your heart and brain   Return to have liver enzymes checked in 3 weeks ( no fasting required,  just a lab visit)

## 2023-02-25 NOTE — Assessment & Plan Note (Signed)
Starrting rosuvastatin   Lab Results  Component Value Date   CHOL 222 (H) 01/31/2023   HDL 41.70 01/31/2023   LDLCALC 144 (H) 01/31/2023   LDLDIRECT 164.0 01/31/2023   TRIG 181.0 (H) 01/31/2023   CHOLHDL 5 01/31/2023

## 2023-02-25 NOTE — Progress Notes (Signed)
Subjective:  Patient ID: Ryan Calderon, male    DOB: July 03, 1970  Age: 52 y.o. MRN: [id number]  CC: The primary encounter diagnosis was Hyperlipidemia associated with type 2 diabetes mellitus (HCC). Diagnoses of Need for influenza vaccination and Type 2 diabetes mellitus without complication, without long-term current use of insulin (HCC) were also pertinent to this visit.   HPI Ryan Calderon presents for  Chief Complaint  Patient presents with   Medical Management of Chronic Issues    Discuss lab results   Ryan Calderon is a 52 yr old male with hereditary hemochromatosis who presents with recent labs indicating the development of  type 2 DM with an A1c of 6.7.    There is a FH  of diabetes in his  father and brother.   He denies unintentional weight loss,  polyuria, polydipsia and neuropathy    No outpatient medications prior to visit.   No facility-administered medications prior to visit.    Review of Systems;  Patient denies headache, fevers, malaise, unintentional weight loss, skin rash, eye pain, sinus congestion and sinus pain, sore throat, dysphagia,  hemoptysis , cough, dyspnea, wheezing, chest pain, palpitations, orthopnea, edema, abdominal pain, nausea, melena, diarrhea, constipation, flank pain, dysuria, hematuria, urinary  Frequency, nocturia, numbness, tingling, seizures,  Focal weakness, Loss of consciousness,  Tremor, insomnia, depression, anxiety, and suicidal ideation.      Objective:  BP 138/88   Pulse 79   Ht 6\' 1"  (1.854 m)   Wt 258 lb 9.6 oz (117.3 kg)   SpO2 99%   BMI 34.12 kg/m   BP Readings from Last 3 Encounters:  02/25/23 138/88  01/31/23 120/78  11/14/22 122/87    Wt Readings from Last 3 Encounters:  02/25/23 258 lb 9.6 oz (117.3 kg)  01/31/23 260 lb 6.4 oz (118.1 kg)  11/14/22 249 lb 14.4 oz (113.4 kg)    Physical Exam Vitals reviewed.  Constitutional:      General: He is not in acute distress.    Appearance: Normal appearance. He is normal  weight. He is not ill-appearing, toxic-appearing or diaphoretic.  HENT:     Head: Normocephalic.  Eyes:     General: No scleral icterus.       Right eye: No discharge.        Left eye: No discharge.     Conjunctiva/sclera: Conjunctivae normal.  Cardiovascular:     Rate and Rhythm: Normal rate and regular rhythm.     Heart sounds: Normal heart sounds.  Pulmonary:     Effort: Pulmonary effort is normal. No respiratory distress.     Breath sounds: Normal breath sounds.  Musculoskeletal:        General: Normal range of motion.     Cervical back: Normal range of motion.  Skin:    General: Skin is warm and dry.  Neurological:     General: No focal deficit present.     Mental Status: He is alert and oriented to person, place, and time. Mental status is at baseline.  Psychiatric:        Mood and Affect: Mood normal.        Behavior: Behavior normal.        Thought Content: Thought content normal.        Judgment: Judgment normal.     Lab Results  Component Value Date   HGBA1C 6.7 (H) 01/31/2023    Lab Results  Component Value Date   CREATININE 1.06 01/31/2023   CREATININE 0.81 10/04/2022  CREATININE 0.91 04/02/2022    Lab Results  Component Value Date   WBC 9.1 01/31/2023   HGB 15.6 01/31/2023   HCT 47.7 01/31/2023   PLT 260.0 01/31/2023   GLUCOSE 83 01/31/2023   CHOL 222 (H) 01/31/2023   TRIG 181.0 (H) 01/31/2023   HDL 41.70 01/31/2023   LDLDIRECT 164.0 01/31/2023   LDLCALC 144 (H) 01/31/2023   ALT 27 01/31/2023   AST 18 01/31/2023   NA 140 01/31/2023   K 4.5 01/31/2023   CL 101 01/31/2023   CREATININE 1.06 01/31/2023   BUN 17 01/31/2023   CO2 31 01/31/2023   TSH 1.61 01/31/2023   PSA 0.43 01/31/2023   HGBA1C 6.7 (H) 01/31/2023    No results found.  Assessment & Plan:  .Hyperlipidemia associated with type 2 diabetes mellitus (HCC) Assessment & Plan: Starrting rosuvastatin   Lab Results  Component Value Date   CHOL 222 (H) 01/31/2023   HDL 41.70  01/31/2023   LDLCALC 144 (H) 01/31/2023   LDLDIRECT 164.0 01/31/2023   TRIG 181.0 (H) 01/31/2023   CHOLHDL 5 01/31/2023     Orders: -     Microalbumin / creatinine urine ratio -     Comprehensive metabolic panel; Future -     Microalbumin / creatinine urine ratio; Future  Need for influenza vaccination -     Flu vaccine trivalent PF, 6mos and older(Flulaval,Afluria,Fluarix,Fluzone)  Type 2 diabetes mellitus without complication, without long-term current use of insulin (HCC) Assessment & Plan: Diagnosed with A1c.  Unclear if 2ndary to [facility].  No hypoglycemics needed currently.  Advised to start crestor and return for urine test for protein.  Low gi diet and exercise reviewed.  Eye exam advised.    Other orders -     Rosuvastatin Calcium; Take 1 tablet (5 mg total) by mouth daily.  Dispense: 90 tablet; Refill: 1     I provided 30 minutes of face-to-face time during this encounter reviewing patient's last visit with me, , previous  labs and imaging studies, counseling on currently addressed issues,  and post visit ordering to diagnostics and therapeutics .   Follow-up: Return in about 6 months (around 08/26/2023) for follow up diabetes.   Sherlene Shams, MD

## 2023-02-25 NOTE — Assessment & Plan Note (Signed)
Diagnosed with A1c.  Unclear if 2ndary to [facility].  No hypoglycemics needed currently.  Advised to start crestor and return for urine test for protein.  Low gi diet and exercise reviewed.  Eye exam advised.

## 2023-03-11 ENCOUNTER — Other Ambulatory Visit (INDEPENDENT_AMBULATORY_CARE_PROVIDER_SITE_OTHER): Payer: 59

## 2023-03-11 DIAGNOSIS — E785 Hyperlipidemia, unspecified: Secondary | ICD-10-CM | POA: Diagnosis not present

## 2023-03-11 DIAGNOSIS — E1169 Type 2 diabetes mellitus with other specified complication: Secondary | ICD-10-CM

## 2023-03-11 LAB — COMPREHENSIVE METABOLIC PANEL
ALT: 36 U/L (ref 0–53)
AST: 24 U/L (ref 0–37)
Albumin: 4.4 g/dL (ref 3.5–5.2)
Alkaline Phosphatase: 61 U/L (ref 39–117)
BUN: 15 mg/dL (ref 6–23)
CO2: 32 meq/L (ref 19–32)
Calcium: 9.4 mg/dL (ref 8.4–10.5)
Chloride: 102 meq/L (ref 96–112)
Creatinine, Ser: 0.99 mg/dL (ref 0.40–1.50)
GFR: 87.8 mL/min (ref >60.00)
Glucose, Bld: 159 mg/dL — ABNORMAL HIGH (ref 70–99)
Potassium: 4.1 meq/L (ref 3.5–5.1)
Sodium: 140 meq/L (ref 135–145)
Total Bilirubin: 1.1 mg/dL (ref 0.2–1.2)
Total Protein: 7 g/dL (ref 6.0–8.3)

## 2023-03-11 LAB — MICROALBUMIN / CREATININE URINE RATIO
Creatinine,U: 136.4 mg/dL
Microalb Creat Ratio: 0.5 mg/g (ref 0.0–30.0)
Microalb, Ur: <0.7 mg/dL (ref 0.0–1.9)

## 2023-08-26 ENCOUNTER — Ambulatory Visit (INDEPENDENT_AMBULATORY_CARE_PROVIDER_SITE_OTHER): Payer: 59 | Admitting: Internal Medicine

## 2023-08-26 ENCOUNTER — Encounter: Payer: Self-pay | Admitting: Internal Medicine

## 2023-08-26 VITALS — BP 128/78 | HR 60 | Ht 73.0 in | Wt 251.6 lb

## 2023-08-26 DIAGNOSIS — D126 Benign neoplasm of colon, unspecified: Secondary | ICD-10-CM | POA: Diagnosis not present

## 2023-08-26 DIAGNOSIS — Z1211 Encounter for screening for malignant neoplasm of colon: Secondary | ICD-10-CM

## 2023-08-26 DIAGNOSIS — E785 Hyperlipidemia, unspecified: Secondary | ICD-10-CM

## 2023-08-26 DIAGNOSIS — E119 Type 2 diabetes mellitus without complications: Secondary | ICD-10-CM

## 2023-08-26 DIAGNOSIS — E1169 Type 2 diabetes mellitus with other specified complication: Secondary | ICD-10-CM

## 2023-08-26 DIAGNOSIS — Z23 Encounter for immunization: Secondary | ICD-10-CM

## 2023-08-26 LAB — COMPREHENSIVE METABOLIC PANEL WITH GFR
ALT: 26 U/L (ref 0–53)
AST: 19 U/L (ref 0–37)
Albumin: 4.4 g/dL (ref 3.5–5.2)
Alkaline Phosphatase: 55 U/L (ref 39–117)
BUN: 16 mg/dL (ref 6–23)
CO2: 27 meq/L (ref 19–32)
Calcium: 9.3 mg/dL (ref 8.4–10.5)
Chloride: 103 meq/L (ref 96–112)
Creatinine, Ser: 0.91 mg/dL (ref 0.40–1.50)
GFR: 96.82 mL/min (ref >60.00)
Glucose, Bld: 139 mg/dL — ABNORMAL HIGH (ref 70–99)
Potassium: 4.2 meq/L (ref 3.5–5.1)
Sodium: 137 meq/L (ref 135–145)
Total Bilirubin: 0.7 mg/dL (ref 0.2–1.2)
Total Protein: 7.3 g/dL (ref 6.0–8.3)

## 2023-08-26 LAB — LIPID PANEL
Cholesterol: 207 mg/dL — ABNORMAL HIGH (ref 0–200)
HDL: 40.1 mg/dL (ref >39.00)
LDL Cholesterol: 142 mg/dL — ABNORMAL HIGH (ref 0–99)
NonHDL: 166.82
Total CHOL/HDL Ratio: 5
Triglycerides: 122 mg/dL (ref 0.0–149.0)
VLDL: 24.4 mg/dL (ref 0.0–40.0)

## 2023-08-26 LAB — LDL CHOLESTEROL, DIRECT: Direct LDL: 151 mg/dL

## 2023-08-26 LAB — HEMOGLOBIN A1C: Hgb A1c MFr Bld: 7.2 % — ABNORMAL HIGH (ref 4.6–6.5)

## 2023-08-26 MED ORDER — ROSUVASTATIN CALCIUM 5 MG PO TABS: 5.0000 mg | ORAL_TABLET | Freq: Every day | ORAL | 1 refills | Status: DC

## 2023-08-26 NOTE — Patient Instructions (Addendum)
 1) You had MULTIPLE  precancerous polyps removed last July during your colonoscopy .  Because of their size and number,  (ONE WAS 2 CM),  your risk of colon cancer is increased;  Dr Ole Berkeley recommended a repeat scope this July and a referral has been make to [facility]  2) You have "diet controlled diabetes mellitus, type 2."  You do NOT need medications at this time to control your blood sugars,  but you DO need to have the following:   1) a "diabetic eye exam"  annually (please schedule at any local optometrist office  2) a statin (which you are taking: rosuvastatin ) to reduce the progression of atherosclerosis and heart disease  3) semi annual visits with me to monitor your glycemic control (goal is < 6.5) and kidney function   4) pneumonia vaccine (which you received today)

## 2023-08-26 NOTE — Progress Notes (Signed)
 Subjective:  Patient ID: Ryan Calderon, male    DOB: [birth year]  Age: 53 y.o. MRN: [id number]  CC: The primary encounter diagnosis was Hyperlipidemia associated with type 2 diabetes mellitus (HCC). Diagnoses of Type 2 diabetes mellitus without complication, without long-term current use of insulin (HCC), Colon cancer screening, Tubular adenoma of colon, Need for pneumococcal 20-valent conjugate vaccination, and Need for Tdap vaccination were also pertinent to this visit.   HPI Ryan Calderon presents for  Chief Complaint  Patient presents with   Medical Management of Chronic Issues    6 month follow up    1) Type 2 DM:  with hyperlipidemia: :   diet controlled.  Crestor  was displaced by in laws who visit daily .  He feels generally well, is exercising daily .  He remains skeptical about the diabetes diagnosis.   Follow a carbohydrate modified diet ,  and  has lost weight using portion reduction  Denies numbness, burning and tingling of extremities. Appetite is good.     2)    Outpatient Medications Prior to Visit  Medication Sig Dispense Refill   rosuvastatin  (CRESTOR ) 5 MG tablet Take 1 tablet (5 mg total) by mouth daily. 90 tablet 1   No facility-administered medications prior to visit.    Review of Systems;  Patient denies headache, fevers, malaise, unintentional weight loss, skin rash, eye pain, sinus congestion and sinus pain, sore throat, dysphagia,  hemoptysis , cough, dyspnea, wheezing, chest pain, palpitations, orthopnea, edema, abdominal pain, nausea, melena, diarrhea, constipation, flank pain, dysuria, hematuria, urinary  Frequency, nocturia, numbness, tingling, seizures,  Focal weakness, Loss of consciousness,  Tremor, insomnia, depression, anxiety, and suicidal ideation.      Objective:  BP 128/78   Pulse 60   Ht 6\' 1"  (1.854 m)   Wt 251 lb 9.6 oz (114.1 kg)   SpO2 96%   BMI 33.19 kg/m   BP Readings from Last 3 Encounters:  08/26/23 128/78  02/25/23 138/88   01/31/23 120/78    Wt Readings from Last 3 Encounters:  08/26/23 251 lb 9.6 oz (114.1 kg)  02/25/23 258 lb 9.6 oz (117.3 kg)  01/31/23 260 lb 6.4 oz (118.1 kg)    Physical Exam  Lab Results  Component Value Date   HGBA1C 6.7 (H) 01/31/2023    Lab Results  Component Value Date   CREATININE 0.99 03/11/2023   CREATININE 1.06 01/31/2023   CREATININE 0.81 10/04/2022    Lab Results  Component Value Date   WBC 9.1 01/31/2023   HGB 15.6 01/31/2023   HCT 47.7 01/31/2023   PLT 260.0 01/31/2023   GLUCOSE 159 (H) 03/11/2023   CHOL 222 (H) 01/31/2023   TRIG 181.0 (H) 01/31/2023   HDL 41.70 01/31/2023   LDLDIRECT 164.0 01/31/2023   LDLCALC 144 (H) 01/31/2023   ALT 36 03/11/2023   AST 24 03/11/2023   NA 140 03/11/2023   K 4.1 03/11/2023   CL 102 03/11/2023   CREATININE 0.99 03/11/2023   BUN 15 03/11/2023   CO2 32 03/11/2023   TSH 1.61 01/31/2023   PSA 0.43 01/31/2023   HGBA1C 6.7 (H) 01/31/2023   MICROALBUR <0.7 03/11/2023    No results found.  Assessment & Plan:  .Hyperlipidemia associated with type 2 diabetes mellitus (HCC) Assessment & Plan: He has resumed rosuvastatin  2-3 weeks ago after a 2 month suspension   Lab Results  Component Value Date   CHOL 222 (H) 01/31/2023   HDL 41.70 01/31/2023   LDLCALC 144 (  H) 01/31/2023   LDLDIRECT 164.0 01/31/2023   TRIG 181.0 (H) 01/31/2023   CHOLHDL 5 01/31/2023     Orders: -     Lipid panel -     LDL cholesterol, direct  Type 2 diabetes mellitus without complication, without long-term current use of insulin (HCC) Assessment & Plan: Diagnosed at last visit.  Statin added .  No protenuria.  Losing weight with portion reduction .   Fot exam normal.  Repeat labs  pending.  Advised of the standards of care  including annual eye exams to screen for retinopathy,  vaccination against pneumonia,, use of statin, and  6 month follow up   Orders: -     Comprehensive metabolic panel with GFR -     Hemoglobin A1c  Colon  cancer screening -     Ambulatory referral to Gastroenterology  Tubular adenoma of colon -     Ambulatory referral to Gastroenterology  Need for pneumococcal 20-valent conjugate vaccination -     Pneumococcal conjugate vaccine 20-valent  Need for Tdap vaccination -     Tdap vaccine greater than or equal to 7yo IM  Other orders -     Rosuvastatin  Calcium ; Take 1 tablet (5 mg total) by mouth daily.  Dispense: 90 tablet; Refill: 1     I spent 31 minutes on the day of this face to face encounter reviewing patient's  most recent labs , counseling on diabetes and weight management,  reviewing the assessment and plan with patient, and post visit ordering and reviewing of  diagnostics and therapeutics with patient  .   Follow-up: Return in about 6 months (around 02/25/2024) for follow up diabetes, physical.   Thersia Flax, MD

## 2023-08-26 NOTE — Assessment & Plan Note (Signed)
 Diagnosed at last visit.  Statin added .  No protenuria.  Losing weight with portion reduction .   Fot exam normal.  Repeat labs  pending.  Advised of the standards of care  including annual eye exams to screen for retinopathy,  vaccination against pneumonia,, use of statin, and  6 month follow up

## 2023-08-26 NOTE — Assessment & Plan Note (Signed)
 He has resumed rosuvastatin  2-3 weeks ago after a 2 month suspension   Lab Results  Component Value Date   CHOL 222 (H) 01/31/2023   HDL 41.70 01/31/2023   LDLCALC 144 (H) 01/31/2023   LDLDIRECT 164.0 01/31/2023   TRIG 181.0 (H) 01/31/2023   CHOLHDL 5 01/31/2023

## 2023-08-28 ENCOUNTER — Encounter: Payer: Self-pay | Admitting: Internal Medicine

## 2023-10-09 ENCOUNTER — Other Ambulatory Visit: Payer: Self-pay

## 2023-10-10 ENCOUNTER — Inpatient Hospital Stay: Payer: 59 | Admitting: Oncology

## 2023-10-10 ENCOUNTER — Inpatient Hospital Stay: Payer: 59 | Attending: Oncology

## 2023-10-10 ENCOUNTER — Encounter: Payer: Self-pay | Admitting: Oncology

## 2023-10-10 LAB — CMP (CANCER CENTER ONLY)
ALT: 27 U/L (ref 0–44)
AST: 19 U/L (ref 15–41)
Albumin: 4.1 g/dL (ref 3.5–5.0)
Alkaline Phosphatase: 65 U/L (ref 38–126)
Anion gap: 9 (ref 5–15)
BUN: 21 mg/dL — ABNORMAL HIGH (ref 6–20)
CO2: 24 mmol/L (ref 22–32)
Calcium: 9 mg/dL (ref 8.9–10.3)
Chloride: 105 mmol/L (ref 98–111)
Creatinine: 1.04 mg/dL (ref 0.61–1.24)
GFR, Estimated: >60 mL/min (ref >60)
Glucose, Bld: 158 mg/dL — ABNORMAL HIGH (ref 70–99)
Potassium: 4.3 mmol/L (ref 3.5–5.1)
Sodium: 138 mmol/L (ref 135–145)
Total Bilirubin: 1.1 mg/dL (ref 0.0–1.2)
Total Protein: 7.3 g/dL (ref 6.5–8.1)

## 2023-10-10 LAB — FERRITIN: Ferritin: 223 ng/mL (ref 24–336)

## 2023-10-10 LAB — CBC (CANCER CENTER ONLY)
HCT: 45.8 % (ref 39.0–52.0)
Hemoglobin: 15.7 g/dL (ref 13.0–17.0)
MCH: 32 pg (ref 26.0–34.0)
MCHC: 34.3 g/dL (ref 30.0–36.0)
MCV: 93.5 fL (ref 80.0–100.0)
Platelet Count: 229 10*3/uL (ref 150–400)
RBC: 4.9 MIL/uL (ref 4.22–5.81)
RDW: 12 % (ref 11.5–15.5)
WBC Count: 8.1 10*3/uL (ref 4.0–10.5)
nRBC: 0 % (ref 0.0–0.2)

## 2023-10-10 NOTE — Progress Notes (Signed)
 Patient presents to be doing well, no new concerns at this time.

## 2023-10-11 NOTE — Progress Notes (Signed)
 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 Valentine Gasmen.  He had elevated ferritin in the 600s at that time and had to undergo phlebotomy.  At that time he was symptomatic with worsening fatigue and joint pains which improved after phlebotomy.  He has not required phlebotomy for over a year now and the plan was to keep his ferritin less than 200.  He subsequently transferred care to Dr. Beverely Buba was also getting [facility] surveillance.  No known history of cirrhosis.  No family history of cirrhosis    Interval history-patient drinks beers about 2-3 times a week and has overall cut down.  He is doing well and denies any complaints at this time.  He also does not take any oral iron supplements  ECOG PS- 1 Pain scale- 0   Review of systems- Review of Systems  Constitutional:  Negative for chills, fever, malaise/fatigue and weight loss.  HENT:  Negative for congestion, ear discharge and nosebleeds.   Eyes:  Negative for blurred vision.  Respiratory:  Negative for cough, hemoptysis, sputum production, shortness of breath and wheezing.   Cardiovascular:  Negative for chest pain, palpitations, orthopnea and claudication.  Gastrointestinal:  Negative for abdominal pain, blood in stool, constipation, diarrhea, heartburn, melena, nausea and vomiting.  Genitourinary:  Negative for dysuria,  flank pain, frequency, hematuria and urgency.  Musculoskeletal:  Negative for back pain, joint pain and myalgias.  Skin:  Negative for rash.  Neurological:  Negative for dizziness, tingling, focal weakness, seizures, weakness and headaches.  Endo/Heme/Allergies:  Does not bruise/bleed easily.  Psychiatric/Behavioral:  Negative for depression and suicidal ideas. The patient does not have insomnia.       Allergies  Allergen Reactions   Peanut-Containing Drug Products Other (See Comments)    Patient allergic to peanuts - states it make him unable to talk above a whisper.     Past Medical History:  Diagnosis Date   BCC (basal cell carcinoma of skin)    Fatty liver    Hereditary hemochromatosis    Hyperlipidemia    Pneumothorax    Complicated course, Dr. Jacquelyne Matte followed     Past Surgical History:  Procedure Laterality Date   COLONOSCOPY WITH PROPOFOL  N/A 11/14/2022   Procedure: COLONOSCOPY WITH PROPOFOL ;  Surgeon: Marnee Sink, MD;  Location: [facility];  Service: Endoscopy;  Laterality: N/A;   POLYPECTOMY  11/14/2022   Procedure: POLYPECTOMY;  Surgeon: Marnee Sink, MD;  Location: [facility];  Service: Endoscopy;;    Social History   Socioeconomic History   Marital status: Married    Spouse name: Not on file   Number of children: Not on file   Years of education: Not on file   Highest education level: Associate degree: academic program  Occupational History   Not on file  Tobacco Use   Smoking status: Never   Smokeless tobacco: Current    Types: Snuff   Tobacco comments:  50yrs with snuff  Substance and Sexual Activity   Alcohol use: Not Currently    Alcohol/week: 7.0 standard drinks of alcohol    Types: 7 Glasses of wine per week    Comment: ocassional beer   Drug use: No   Sexual activity: Yes    Partners: Female  Other Topics Concern   Not on file  Social History Narrative   Married 1995   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  E6.  Explosive ordinance disposal, forward air controller.   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     Social Drivers of Corporate investment banker Strain: Low Risk  (08/25/2023)   Overall Financial Resource Strain (CARDIA)    Difficulty of Paying Living Expenses: Not hard at all  Food Insecurity: No Food Insecurity (08/25/2023)   Hunger Vital Sign    Worried About Running Out of Food in the Last Year: Never true    Ran Out of Food in the Last Year: Never true  Transportation Needs: No Transportation Needs (08/25/2023)   PRAPARE - Administrator, Civil Service (Medical): No    Lack of Transportation (Non-Medical): No  Physical Activity: Insufficiently Active (08/25/2023)   Exercise Vital Sign    Days of Exercise per Week: 2 days    Minutes of Exercise per Session: 20 min  Stress: No Stress Concern Present (08/25/2023)   [employer] of Occupational Health - Occupational Stress Questionnaire    Feeling of Stress : Not at all  Social Connections: Moderately Integrated (08/25/2023)   Social Connection and Isolation Panel [NHANES]    Frequency of Communication with Friends and Family: More than three times a week    Frequency of Social Gatherings with Friends and Family: Twice a week    Attends Religious Services: More than 4 times per year    Active Member of [employer] or Organizations: No    Attends Engineer, structural: Not on file    Marital Status: Married  Catering manager Violence: Not on file    Family History  Problem Relation Age of Onset   Prostate cancer Father 39   COPD Father        smoker   Diabetes Other    Liver disease Brother    Colon cancer Neg Hx      Current Outpatient Medications:    rosuvastatin  (CRESTOR ) 5 MG tablet, Take 1 tablet (5 mg total) by mouth daily., Disp: 90 tablet, Rfl: 1  Physical exam:   Vitals:   10/10/23 1052  BP: (!) 130/95  Pulse: 80  Resp: 18  Temp: 98.6 F (37 C)  TempSrc: Tympanic  SpO2: 97%  Weight: 250 lb 11.2 oz (113.7 kg)  Height: 6\' 1"  (1.854 m)   Physical Exam Cardiovascular:     Rate and Rhythm: Normal rate and regular rhythm.     Heart sounds: Normal heart sounds.  Pulmonary:     Effort: Pulmonary effort is normal.     Breath sounds: Normal breath sounds.  Abdominal:     General: Bowel sounds are normal.     Palpations: Abdomen is soft.  Skin:    General: Skin is warm and dry.  Neurological:     Mental Status: He is alert and oriented to person, place, and time.      I have personally reviewed labs listed  below:    Latest Ref Rng & Units 10/10/2023   10:06 AM  CMP  Glucose 70 - 99 mg/dL 841   BUN 6 - 20 mg/dL 21   Creatinine 3.24 - 1.24 mg/dL 4.01   Sodium 027 - 253 mmol/L 138   Potassium 3.5 - 5.1 mmol/L 4.3   Chloride 98 - 111 mmol/L 105   CO2 22 - 32 mmol/L 24   Calcium  8.9 - 10.3 mg/dL 9.0   Total Protein 6.5 - 8.1 g/dL 7.3   Total Bilirubin 0.0 - 1.2 mg/dL 1.1   Alkaline Phos 38 - 126 U/L 65   AST 15 - 41 U/L 19   ALT 0 - 44 U/L 27       Latest Ref Rng & Units 10/10/2023   10:05 AM  CBC  WBC 4.0 - 10.5 K/uL 8.1   Hemoglobin 13.0 - 17.0 g/dL 66.4   Hematocrit 40.3 - 52.0 % 45.8   Platelets 150 - 400 K/uL 229     Assessment and plan- Patient is a 53 y.o. male with heterozygosity for H63D any evidence of clinical iron overload here for routine follow-up  I have encouraged patient to try to cut down on his alcohol intake even furtherIf possible.  Overall patient does not have any evidence of iron overload and his ferritin levels are normal at 223.  CBC and CMP is within normal limits.  He does not have any baseline evidence ofCirrhosis on imaging which was last done in June 2021.  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  He does not require any HCC surveillance at this time.  I will continue to  see him on a yearly basis   Visit Diagnosis 1. Hereditary hemochromatosis (HCC)      Dr. Seretha Dance, MD, MPH [facility] at [facility] [phone] 10/11/2023 11:58 AM

## 2023-11-12 ENCOUNTER — Encounter: Payer: Self-pay | Admitting: Internal Medicine

## 2023-11-12 ENCOUNTER — Ambulatory Visit (INDEPENDENT_AMBULATORY_CARE_PROVIDER_SITE_OTHER): Admitting: Internal Medicine

## 2023-11-12 VITALS — BP 120/88 | HR 71 | Ht 73.0 in | Wt 248.6 lb

## 2023-11-12 DIAGNOSIS — K760 Fatty (change of) liver, not elsewhere classified: Secondary | ICD-10-CM

## 2023-11-12 DIAGNOSIS — E119 Type 2 diabetes mellitus without complications: Secondary | ICD-10-CM

## 2023-11-12 NOTE — Progress Notes (Unsigned)
 Subjective:  Patient ID: Ryan Calderon, male    DOB: [birth year]  Age: 53 y.o. MRN: [id number]  CC: The primary encounter diagnosis was Type 2 diabetes mellitus without complication, without long-term current use of insulin (HCC). A diagnosis of Hepatic steatosis was also pertinent to this visit.   HPI Ryan Calderon presents for  Chief Complaint  Patient presents with   Medical Management of Chronic Issues   Follow up on new dx of type 2 DM addressed with diet ,  statin and weight loss of 12 lbs since Sept 2024.   Has been swimming ,  doing portion reduction . Averages 2 meals daily (skips lunch or breakfast and eats  dinner by 5:30) .  Not checking BS.  Feels generally well.  Brother has type 1 diabetes,  not well controlled.. multiple questions bout the pathophysiology of diabetes answered today     Lab Results  Component Value Date   HGBA1C 7.2 (H) 08/26/2023   2)Hypertension  the 4 of the last 6 readings in office have been elevated.  Home reading has     been done a   Outpatient Medications Prior to Visit  Medication Sig Dispense Refill   rosuvastatin  (CRESTOR ) 5 MG tablet Take 1 tablet (5 mg total) by mouth daily. 90 tablet 1   No facility-administered medications prior to visit.    Review of Systems;  Patient denies headache, fevers, malaise, unintentional weight loss, skin rash, eye pain, sinus congestion and sinus pain, sore throat, dysphagia,  hemoptysis , cough, dyspnea, wheezing, chest pain, palpitations, orthopnea, edema, abdominal pain, nausea, melena, diarrhea, constipation, flank pain, dysuria, hematuria, urinary  Frequency, nocturia, numbness, tingling, seizures,  Focal weakness, Loss of consciousness,  Tremor, insomnia, depression, anxiety, and suicidal ideation.      Objective:  BP 120/88   Pulse 71   Ht 6' 1 (1.854 m)   Wt 248 lb 9.6 oz (112.8 kg)   SpO2 97%   BMI 32.80 kg/m   BP Readings from Last 3 Encounters:  11/12/23 120/88  10/10/23 (!) 130/95   08/26/23 128/78    Wt Readings from Last 3 Encounters:  11/12/23 248 lb 9.6 oz (112.8 kg)  10/10/23 250 lb 11.2 oz (113.7 kg)  08/26/23 251 lb 9.6 oz (114.1 kg)    Physical Exam Vitals reviewed.  Constitutional:      General: He is not in acute distress.    Appearance: Normal appearance. He is normal weight. He is not ill-appearing, toxic-appearing or diaphoretic.  HENT:     Head: Normocephalic.  Eyes:     General: No scleral icterus.       Right eye: No discharge.        Left eye: No discharge.     Conjunctiva/sclera: Conjunctivae normal.  Cardiovascular:     Rate and Rhythm: Normal rate and regular rhythm.     Heart sounds: Normal heart sounds.  Pulmonary:     Effort: Pulmonary effort is normal. No respiratory distress.     Breath sounds: Normal breath sounds.  Musculoskeletal:        General: Normal range of motion.     Cervical back: Normal range of motion.  Skin:    General: Skin is warm and dry.  Neurological:     General: No focal deficit present.     Mental Status: He is alert and oriented to person, place, and time. Mental status is at baseline.  Psychiatric:        Mood  and Affect: Mood normal.        Behavior: Behavior normal.        Thought Content: Thought content normal.        Judgment: Judgment normal.    Lab Results  Component Value Date   HGBA1C 7.2 (H) 08/26/2023   HGBA1C 6.7 (H) 01/31/2023    Lab Results  Component Value Date   CREATININE 1.04 10/10/2023   CREATININE 0.91 08/26/2023   CREATININE 0.99 03/11/2023    Lab Results  Component Value Date   WBC 8.1 10/10/2023   HGB 15.7 10/10/2023   HCT 45.8 10/10/2023   PLT 229 10/10/2023   GLUCOSE 158 (H) 10/10/2023   CHOL 207 (H) 08/26/2023   TRIG 122.0 08/26/2023   HDL 40.10 08/26/2023   LDLDIRECT 151.0 08/26/2023   LDLCALC 142 (H) 08/26/2023   ALT 27 10/10/2023   AST 19 10/10/2023   NA 138 10/10/2023   K 4.3 10/10/2023   CL 105 10/10/2023   CREATININE 1.04 10/10/2023   BUN 21  (H) 10/10/2023   CO2 24 10/10/2023   TSH 1.61 01/31/2023   PSA 0.43 01/31/2023   HGBA1C 7.2 (H) 08/26/2023   MICROALBUR 1.2 11/12/2023    No results found.  Assessment & Plan:  .Type 2 diabetes mellitus without complication, without long-term current use of insulin (HCC) Assessment & Plan: Diagnosed at last visit.  Statin added .  he has no proteinuria  and is losing weight with portion reduction .   Foot exam normal.  Reviewed the standards of care  including annual eye exams to screen for retinopathy,  vaccination against pneumonia,, use of statin, and  6 month follow up   [facility] monitor placed on arm for 2 weeks period to facilitate patient's understanding of the role of exercise and diet in managing diabetes.   Lab Results  Component Value Date   HGBA1C 7.2 (H) 08/26/2023   Lab Results  Component Value Date   MICROALBUR 1.2 11/12/2023       Orders: -     Microalbumin / creatinine urine ratio -     Hemoglobin A1c; Future -     Comprehensive metabolic panel with GFR; Future -     Lipid Panel w/reflex Direct LDL; Future  Hepatic steatosis Assessment & Plan: Noted on 2021 abd ultrasound .  Reviewed recs for Hep A/B vaccination.  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  I ave recommended revaccination, trial of metformin (which he has declined)   and statin    Mediterranean diet reviewed  Lab Results  Component Value Date   ALT 27 10/10/2023   AST 19 10/10/2023   ALKPHOS 65 10/10/2023   BILITOT 1.1 10/10/2023         I spent 34 minutes on the day of this face to face encounter reviewing patient's   recent  labs and imaging studies, counseling on diabetes and weight management,  reviewing the assessment and plan with patient, and post visit ordering and reviewing of  diagnostics and therapeutics with patient  .   Follow-up: Return in about 3 months (around 02/26/2024) for follow up diabetes.   Verneita LITTIE Kettering, MD

## 2023-11-12 NOTE — Patient Instructions (Addendum)
 1) I am placing a [employer] continuous blood glucose monitor on you today so we can gather some information about how your blood sugars are ranging over the next 14 days  Continue your usual routines.  If you want to get the MOST out of this monitor,  use the APP to notate what you are eating and WHEN so we can correlate the blood sugars with the meals (same goes for exercise)  A NORMAL BLOOD SUGAR IS  80 TO 125 IN A FASTING STATE  < 160   2 HRS AFTER EATING A MEAL    WE CAN RECHECK YOUR A1C AFTER JULY 22   2) Your blood pressure is elevated today and has been  for the last several visits.   Please check your blood pressure a few times at home or at work  and send me the readings OR BRING THEM TO YOUR LAB APPT ON JUL 22  so I can determine if you need to start MEDICATION.  The urine test today is to look for signs of kidney stress due to elevated blood pressure .  If it is positive I will recommend a low dose of losartan to keep this from getting worse

## 2023-11-13 LAB — MICROALBUMIN / CREATININE URINE RATIO
Creatinine,U: 232.5 mg/dL
Microalb Creat Ratio: 5.1 mg/g (ref 0.0–30.0)
Microalb, Ur: 1.2 mg/dL (ref 0.0–1.9)

## 2023-11-13 NOTE — Assessment & Plan Note (Addendum)
 Noted on 2021 abd ultrasound .  Reviewed recs for Hep A/B vaccination.  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  I ave recommended revaccination, trial of metformin (which he has declined)   and statin    Mediterranean diet reviewed  Lab Results  Component Value Date   ALT 27 10/10/2023   AST 19 10/10/2023   ALKPHOS 65 10/10/2023   BILITOT 1.1 10/10/2023

## 2023-11-13 NOTE — Assessment & Plan Note (Signed)
 Diagnosed at last visit.  Statin added .  he has no proteinuria  and is losing weight with portion reduction .   Foot exam normal.  Reviewed the standards of care  including annual eye exams to screen for retinopathy,  vaccination against pneumonia,, use of statin, and  6 month follow up   [facility] monitor placed on arm for 2 weeks period to facilitate patient's understanding of the role of exercise and diet in managing diabetes.   Lab Results  Component Value Date   HGBA1C 7.2 (H) 08/26/2023   Lab Results  Component Value Date   MICROALBUR 1.2 11/12/2023

## 2023-11-16 ENCOUNTER — Ambulatory Visit: Payer: Self-pay | Admitting: Internal Medicine

## 2023-11-24 ENCOUNTER — Other Ambulatory Visit: Payer: Self-pay

## 2023-11-24 ENCOUNTER — Encounter: Payer: Self-pay | Admitting: Gastroenterology

## 2023-11-24 NOTE — Anesthesia Preprocedure Evaluation (Addendum)
 Anesthesia Evaluation  Patient identified by MRN, date of birth, ID band Patient awake    Reviewed: Allergy & Precautions, H&P , NPO status , Patient's Chart, lab work & pertinent test results  Airway Mallampati: III  TM Distance: >3 FB     Dental   Pulmonary neg pulmonary ROS          Cardiovascular negative cardio ROS      Neuro/Psych         Interval history-patient drinks beers about 2-3 times a week and has overall cut down  negative neurological ROS  negative psych ROS   GI/Hepatic negative GI ROS, Neg liver ROS,,,  Endo/Other  negative endocrine ROSdiabetes    Renal/GU negative Renal ROS  negative genitourinary   Musculoskeletal negative musculoskeletal ROS (+)    Abdominal   Peds negative pediatric ROS (+)  Hematology negative hematology ROS (+)   Anesthesia Other Findings Previous colonoscopy 11-14-22 Dr. Ola anesthesiologist  Medical History  Pneumothorax  Hyperlipidemia Hereditary hemochromatosis  BCC (basal cell carcinoma of skin) Fatty liver     Reproductive/Obstetrics negative OB ROS                              Anesthesia Physical Anesthesia Plan  ASA: 2  Anesthesia Plan: General   Post-op Pain Management:    Induction: Intravenous  PONV Risk Score and Plan:   Airway Management Planned: Natural Airway and Nasal Cannula  Additional Equipment:   Intra-op Plan:   Post-operative Plan:   Informed Consent: I have reviewed the patients History and Physical, chart, labs and discussed the procedure including the risks, benefits and alternatives for the proposed anesthesia with the patient or authorized representative who has indicated his/her understanding and acceptance.     Dental Advisory Given  Plan Discussed with: Anesthesiologist, CRNA and Surgeon  Anesthesia Plan Comments: (Patient consented for risks of anesthesia including but not limited to:   - adverse reactions to medications - risk of airway placement if required - damage to eyes, teeth, lips or other oral mucosa - nerve damage due to positioning  - sore throat or hoarseness - Damage to heart, brain, nerves, lungs, other parts of body or loss of life  Patient voiced understanding and assent.)         Anesthesia Quick Evaluation

## 2023-12-01 ENCOUNTER — Encounter
Admission: RE | Disposition: A | Discharge status: Home / Self Care | Payer: Self-pay | Source: Home / Self Care | Attending: Gastroenterology

## 2023-12-01 ENCOUNTER — Ambulatory Visit
Admission: RE | Admit: 2023-12-01 | Discharge: 2023-12-01 | Disposition: A | Discharge status: Home / Self Care | Attending: Gastroenterology | Admitting: Gastroenterology

## 2023-12-01 ENCOUNTER — Ambulatory Visit: Payer: Self-pay | Admitting: Anesthesiology

## 2023-12-01 ENCOUNTER — Encounter: Payer: Self-pay | Admitting: Gastroenterology

## 2023-12-01 ENCOUNTER — Other Ambulatory Visit: Payer: Self-pay

## 2023-12-01 DIAGNOSIS — K635 Polyp of colon: Secondary | ICD-10-CM | POA: Diagnosis not present

## 2023-12-01 DIAGNOSIS — Z1211 Encounter for screening for malignant neoplasm of colon: Secondary | ICD-10-CM | POA: Diagnosis not present

## 2023-12-01 DIAGNOSIS — E119 Type 2 diabetes mellitus without complications: Secondary | ICD-10-CM | POA: Diagnosis not present

## 2023-12-01 DIAGNOSIS — Z860101 Personal history of adenomatous and serrated colon polyps: Secondary | ICD-10-CM | POA: Diagnosis not present

## 2023-12-01 DIAGNOSIS — D123 Benign neoplasm of transverse colon: Secondary | ICD-10-CM | POA: Diagnosis not present

## 2023-12-01 DIAGNOSIS — K5730 Diverticulosis of large intestine without perforation or abscess without bleeding: Secondary | ICD-10-CM | POA: Diagnosis not present

## 2023-12-01 HISTORY — PX: COLONOSCOPY: SHX5424

## 2023-12-01 HISTORY — PX: POLYPECTOMY: SHX149

## 2023-12-01 HISTORY — DX: Type 2 diabetes mellitus without complications: E11.9

## 2023-12-01 SURGERY — COLONOSCOPY
Anesthesia: General | Site: Rectum

## 2023-12-01 MED ORDER — STERILE WATER FOR IRRIGATION IR SOLN
Status: DC | PRN
  Administered 2023-12-01: 250 mL

## 2023-12-01 MED ORDER — PROPOFOL 10 MG/ML IV BOLUS
INTRAVENOUS | Status: AC
  Filled 2023-12-01: qty 40

## 2023-12-01 MED ORDER — LIDOCAINE HCL (PF) 2 % IJ SOLN
INTRAMUSCULAR | Status: AC
  Filled 2023-12-01: qty 5

## 2023-12-01 MED ORDER — LIDOCAINE HCL (CARDIAC) PF 100 MG/5ML IV SOSY
PREFILLED_SYRINGE | INTRAVENOUS | Status: DC | PRN
  Administered 2023-12-01: 50 mg via INTRAVENOUS

## 2023-12-01 MED ORDER — LACTATED RINGERS IV SOLN: INTRAVENOUS | Status: DC

## 2023-12-01 MED ORDER — PROPOFOL 10 MG/ML IV BOLUS
INTRAVENOUS | Status: DC | PRN
Start: 2023-12-01 — End: 2023-12-01
  Administered 2023-12-01: 30 mg via INTRAVENOUS
  Administered 2023-12-01: 20 mg via INTRAVENOUS
  Administered 2023-12-01: 30 mg via INTRAVENOUS
  Administered 2023-12-01: 40 mg via INTRAVENOUS
  Administered 2023-12-01: 100 mg via INTRAVENOUS

## 2023-12-01 SURGICAL SUPPLY — 16 items
CLIP HMST 235XBRD CATH ROT (MISCELLANEOUS) IMPLANT
ELECTRODE REM PT RTRN 9FT ADLT (ELECTROSURGICAL) IMPLANT
FORCEPS BIOP RAD 4 LRG CAP 4 (CUTTING FORCEPS) IMPLANT
FORCEPS ESCP3.2XJMB 240X2.8X (MISCELLANEOUS) IMPLANT
GAUZE SPONGE 4X4 12PLY STRL (GAUZE/BANDAGES/DRESSINGS) IMPLANT
GOWN CVR UNV OPN BCK APRN NK (MISCELLANEOUS) ×4 IMPLANT
INJECTOR VARIJECT VIN23 (MISCELLANEOUS) IMPLANT
KIT DEFENDO VALVE AND CONN (KITS) IMPLANT
KIT PRC NS LF DISP ENDO (KITS) ×2 IMPLANT
MANIFOLD NEPTUNE II (INSTRUMENTS) ×2 IMPLANT
MARKER SPOT ENDO TATTOO 5ML (MISCELLANEOUS) IMPLANT
PROBE APC STR FIRE (PROBE) IMPLANT
RETRIEVER NET ROTH 2.5X230 LF (MISCELLANEOUS) IMPLANT
SNARE COLD EXACTO (MISCELLANEOUS) IMPLANT
TRAP ETRAP POLY (MISCELLANEOUS) IMPLANT
WATER STERILE IRR 250ML POUR (IV SOLUTION) ×2 IMPLANT

## 2023-12-01 NOTE — Op Note (Signed)
 [facility] Gastroenterology Patient Name: Ryan Calderon Procedure Date: 12/01/2023 7:08 AM MRN: [id number] Account #: [id number] Date of Birth: 1970/10/19 Admit Type: Outpatient Age: 53 Room: [facility] OR ROOM 01 Gender: Male Note Status: Finalized Instrument Name: [id number] Procedure:             Colonoscopy Indications:           Surveillance: High risk for colon cancer and History                         of adenomatous polyps, last colonoscopy (<3 yr), Last                         colonoscopy: July 2024 Providers:             Corinn Jess Brooklyn MD, MD Referring MD:          Corinn Jess Brooklyn MD, MD (Referring MD), Verneita Kettering, MD (Referring MD) Medicines:             General Anesthesia Complications:         No immediate complications. Estimated blood loss: None. Procedure:             Pre-Anesthesia Assessment:                        - Prior to the procedure, a History and Physical was                         performed, and patient medications and allergies were                         reviewed. The patient is competent. The risks and                         benefits of the procedure and the sedation options and                         risks were discussed with the patient. All questions                         were answered and informed consent was obtained.                         Patient identification and proposed procedure were                         verified by the physician, the nurse, the                         anesthesiologist, the anesthetist and the technician                         in the pre-procedure area in the procedure room in the                         endoscopy suite. Mental Status Examination: alert and  oriented. Airway Examination: normal oropharyngeal                         airway and neck mobility. Respiratory Examination:                         clear to auscultation. CV Examination: normal.                          Prophylactic Antibiotics: The patient does not require                         prophylactic antibiotics. Prior Anticoagulants: The                         patient has taken no anticoagulant or antiplatelet                         agents. ASA Grade Assessment: II - A patient with mild                         systemic disease. After reviewing the risks and                         benefits, the patient was deemed in satisfactory                         condition to undergo the procedure. The anesthesia                         plan was to use general anesthesia. Immediately prior                         to administration of medications, the patient was                         re-assessed for adequacy to receive sedatives. The                         heart rate, respiratory rate, oxygen saturations,                         blood pressure, adequacy of pulmonary ventilation, and                         response to care were monitored throughout the                         procedure. The physical status of the patient was                         re-assessed after the procedure.                        After obtaining informed consent, the colonoscope was                         passed under direct vision. Throughout the procedure,  the patient's blood pressure, pulse, and oxygen                         saturations were monitored continuously. The                         Colonoscope was introduced through the anus and                         advanced to the the terminal ileum, with                         identification of the appendiceal orifice and IC                         valve. The colonoscopy was performed without                         difficulty. The patient tolerated the procedure well.                         The quality of the bowel preparation was evaluated                         using the BBPS [facility] Bowel Preparation Scale) with                          scores of: Right Colon = 3, Transverse Colon = 3 and                         Left Colon = 3 (entire mucosa seen well with no                         residual staining, small fragments of stool or opaque                         liquid). The total BBPS score equals 9. The terminal                         ileum, ileocecal valve, appendiceal orifice, and                         rectum were photographed. Findings:      The perianal and digital rectal examinations were normal. Pertinent       negatives include normal sphincter tone and no palpable rectal lesions.      A diminutive polyp was found in the transverse colon. The polyp was       sessile. The polyp was removed with a jumbo cold forceps. Resection and       retrieval were complete.      The retroflexed view of the distal rectum and anal verge was normal and       showed no anal or rectal abnormalities.      Many diverticula were found in the sigmoid colon. Impression:            - One diminutive polyp in the transverse colon,  removed with a jumbo cold forceps. Resected and                         retrieved.                        - The distal rectum and anal verge are normal on                         retroflexion view.                        - Diverticulosis in the sigmoid colon. Recommendation:        - Discharge patient to home (with spouse).                        - Resume previous diet today.                        - Continue present medications.                        - Await pathology results.                        - Repeat colonoscopy in 3 years for surveillance of                         multiple polyps. Procedure Code(s):     --- Professional ---                        [phone], Colonoscopy, flexible; with biopsy, single or                         multiple Diagnosis Code(s):     --- Professional ---                        Z86.010, Personal history of colonic polyps                         D12.3, Benign neoplasm of transverse colon (hepatic                         flexure or splenic flexure)                        K57.30, Diverticulosis of large intestine without                         perforation or abscess without bleeding CPT copyright 2022 American Medical Association. All rights reserved. The codes documented in this report are preliminary and upon coder review may  be revised to meet current compliance requirements. Dr. Corinn Brooklyn Corinn Jess Brooklyn MD, MD 12/01/2023 8:05:49 AM This report has been signed electronically. Number of Addenda: 0 Note Initiated On: 12/01/2023 7:08 AM Scope Withdrawal Time: 0 hours 7 minutes 11 seconds  Total Procedure Duration: 0 hours 9 minutes 22 seconds  Estimated Blood Loss:  Estimated blood loss: none.      [facility]

## 2023-12-01 NOTE — Anesthesia Postprocedure Evaluation (Signed)
 Anesthesia Post Note  Patient: Ryan Calderon  Procedure(s) Performed: COLONOSCOPY (Rectum) POLYPECTOMY, INTESTINE (Rectum)  Patient location during evaluation: PACU Anesthesia Type: General Level of consciousness: awake and alert Pain management: pain level controlled Vital Signs Assessment: post-procedure vital signs reviewed and stable Respiratory status: spontaneous breathing, nonlabored ventilation, respiratory function stable and patient connected to nasal cannula oxygen Cardiovascular status: blood pressure returned to baseline and stable Postop Assessment: no apparent nausea or vomiting Anesthetic complications: no   No notable events documented.   Last Vitals:  Vitals:   12/01/23 0806 12/01/23 0814  BP: 129/89 (!) 116/92  Pulse: 80 79  Resp: 14 (!) 22  Temp: 36.4 C 36.4 C  SpO2: 93% 96%    Last Pain:  Vitals:   12/01/23 0708  TempSrc: Temporal  PainSc: 0-No pain                 Eular Panek C Karanvir Balderston

## 2023-12-01 NOTE — Transfer of Care (Signed)
 Immediate Anesthesia Transfer of Care Note  Patient: Ryan Calderon  Procedure(s) Performed: COLONOSCOPY  Patient Location: PACU  Anesthesia Type: General  Level of Consciousness: awake, alert  and patient cooperative  Airway and Oxygen Therapy: Patient Spontanous Breathing and Patient connected to supplemental oxygen  Post-op Assessment: Post-op Vital signs reviewed, Patient's Cardiovascular Status Stable, Respiratory Function Stable, Patent Airway and No signs of Nausea or vomiting  Post-op Vital Signs: Reviewed and stable  Complications: No notable events documented.

## 2023-12-01 NOTE — H&P (Signed)
 Ryan JONELLE Brooklyn, MD [facility] Gastroenterology, DHIP [address]  [place], [place] [address]  Main: [phone] Fax:  [phone] Pager: [phone]   Primary Care Physician:  Ryan Verneita CROME, MD Primary Gastroenterologist:  Dr. Corinn JONELLE Calderon  Pre-Procedure History & Physical: HPI:  Ryan Calderon is a 53 y.o. male is here for an colonoscopy.   Past Medical History:  Diagnosis Date   BCC (basal cell carcinoma of skin)    Fatty liver    Hereditary hemochromatosis    Hyperlipidemia    Pneumothorax    Complicated course, Dr. Zana followed   Type 2 diabetes mellitus [facility])     Past Surgical History:  Procedure Laterality Date   COLONOSCOPY WITH PROPOFOL  N/A 11/14/2022   Procedure: COLONOSCOPY WITH PROPOFOL ;  Surgeon: Jinny Carmine, MD;  Location: [facility];  Service: Endoscopy;  Laterality: N/A;   POLYPECTOMY  11/14/2022   Procedure: POLYPECTOMY;  Surgeon: Jinny Carmine, MD;  Location: [facility];  Service: Endoscopy;;    Prior to Admission medications   Medication Sig Start Date End Date Taking? Authorizing Provider  rosuvastatin  (CRESTOR ) 5 MG tablet Take 1 tablet (5 mg total) by mouth daily. 08/26/23  Yes Ryan Verneita CROME, MD    Allergies as of 10/22/2023 - Review Complete 10/10/2023  Allergen Reaction Noted   Peanut-containing drug products Other (See Comments) 09/19/2015    Family History  Problem Relation Age of Onset   Prostate cancer Father 41   COPD Father        smoker   Diabetes Other    Liver disease Brother    Colon cancer Neg Hx     Social History   Socioeconomic History   Marital status: Married    Spouse name: Not on file   Number of children: Not on file   Years of education: Not on file   Highest education level: Associate degree: academic program  Occupational History   Not on file  Tobacco Use   Smoking status: Never   Smokeless tobacco: Current    Types: Snuff   Tobacco comments:    8yrs with snuff   Substance and Sexual Activity   Alcohol use: Not Currently    Alcohol/week: 7.0 standard drinks of alcohol    Types: 7 Glasses of wine per week    Comment: ocassional beer   Drug use: No   Sexual activity: Yes    Partners: Female  Other Topics Concern   Not on file  Social History Narrative   Married 1995   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  [employer].  Explosive ordinance disposal, forward air controller.   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     Social Drivers of Corporate investment banker Strain: Low Risk  (08/25/2023)   Overall Financial Resource Strain (CARDIA)    Difficulty of Paying Living Expenses: Not hard at all  Food Insecurity: No Food Insecurity (08/25/2023)   Hunger Vital Sign    Worried About Running Out of Food in the Last Year: Never true    Ran Out of Food in the Last Year: Never true  Transportation Needs: No Transportation Needs (08/25/2023)   PRAPARE - Administrator, Civil Service (Medical): No    Lack of Transportation (Non-Medical): No  Physical Activity: Insufficiently Active (08/25/2023)   Exercise Vital Sign  Days of Exercise per Week: 2 days    Minutes of Exercise per Session: 20 min  Stress: No Stress Concern Present (08/25/2023)   [employer] of Occupational Health - Occupational Stress Questionnaire    Feeling of Stress : Not at all  Social Connections: Moderately Integrated (08/25/2023)   Social Connection and Isolation Panel    Frequency of Communication with Friends and Family: More than three times a week    Frequency of Social Gatherings with Friends and Family: Twice a week    Attends Religious Services: More than 4 times per year    Active Member of [employer] or Organizations: No    Attends Engineer, structural: Not on file    Marital Status: Married  Catering manager Violence: Not on  file    Review of Systems: See HPI, otherwise negative ROS  Physical Exam: BP (!) 138/91   Pulse 76   Temp 98.1 F (36.7 C) (Temporal)   Resp 11   Ht 6' 0.99 (1.854 m)   Wt 109.1 kg   SpO2 96%   BMI 31.74 kg/m  General:   Alert,  pleasant and cooperative in NAD Head:  Normocephalic and atraumatic. Neck:  Supple; no masses or thyromegaly. Lungs:  Clear throughout to auscultation.    Heart:  Regular rate and rhythm. Abdomen:  Soft, nontender and nondistended. Normal bowel sounds, without guarding, and without rebound.   Neurologic:  Alert and  oriented x4;  grossly normal neurologically.  Impression/Plan: Ryan Calderon is here for an colonoscopy to be performed for h/o colon adenomas  Risks, benefits, limitations, and alternatives regarding  colonoscopy have been reviewed with the patient.  Questions have been answered.  All parties agreeable.   Ryan Brooklyn, MD  12/01/2023, 7:40 AM

## 2023-12-05 LAB — SURGICAL PATHOLOGY

## 2023-12-10 ENCOUNTER — Ambulatory Visit: Payer: Self-pay | Admitting: Gastroenterology

## 2023-12-10 NOTE — Progress Notes (Signed)
 Recommend surveillance colonoscopy in 3 years  RV

## 2024-02-25 ENCOUNTER — Encounter: Payer: Self-pay | Admitting: Internal Medicine

## 2024-02-25 ENCOUNTER — Ambulatory Visit (INDEPENDENT_AMBULATORY_CARE_PROVIDER_SITE_OTHER): Admitting: Internal Medicine

## 2024-02-25 VITALS — BP 134/94 | HR 70 | Ht 72.0 in | Wt 241.2 lb

## 2024-02-25 DIAGNOSIS — E1169 Type 2 diabetes mellitus with other specified complication: Secondary | ICD-10-CM

## 2024-02-25 DIAGNOSIS — E119 Type 2 diabetes mellitus without complications: Secondary | ICD-10-CM

## 2024-02-25 DIAGNOSIS — Z125 Encounter for screening for malignant neoplasm of prostate: Secondary | ICD-10-CM | POA: Diagnosis not present

## 2024-02-25 DIAGNOSIS — M791 Myalgia, unspecified site: Secondary | ICD-10-CM

## 2024-02-25 DIAGNOSIS — R5383 Other fatigue: Secondary | ICD-10-CM

## 2024-02-25 DIAGNOSIS — T466X5A Adverse effect of antihyperlipidemic and antiarteriosclerotic drugs, initial encounter: Secondary | ICD-10-CM

## 2024-02-25 DIAGNOSIS — Z23 Encounter for immunization: Secondary | ICD-10-CM

## 2024-02-25 DIAGNOSIS — E785 Hyperlipidemia, unspecified: Secondary | ICD-10-CM

## 2024-02-25 LAB — COMPREHENSIVE METABOLIC PANEL WITH GFR
ALT: 25 U/L (ref 0–53)
AST: 20 U/L (ref 0–37)
Albumin: 4.6 g/dL (ref 3.5–5.2)
Alkaline Phosphatase: 60 U/L (ref 39–117)
BUN: 14 mg/dL (ref 6–23)
CO2: 30 meq/L (ref 19–32)
Calcium: 9.1 mg/dL (ref 8.4–10.5)
Chloride: 101 meq/L (ref 96–112)
Creatinine, Ser: 0.94 mg/dL (ref 0.40–1.50)
GFR: 92.8 mL/min (ref >60.00)
Glucose, Bld: 100 mg/dL — ABNORMAL HIGH (ref 70–99)
Potassium: 4.2 meq/L (ref 3.5–5.1)
Sodium: 137 meq/L (ref 135–145)
Total Bilirubin: 1 mg/dL (ref 0.2–1.2)
Total Protein: 7.1 g/dL (ref 6.0–8.3)

## 2024-02-25 LAB — PSA: PSA: 0.5 ng/mL (ref 0.10–4.00)

## 2024-02-25 LAB — LIPID PANEL
Cholesterol: 197 mg/dL (ref 0–200)
HDL: 43.8 mg/dL (ref >39.00)
LDL Cholesterol: 135 mg/dL — ABNORMAL HIGH (ref 0–99)
NonHDL: 153.02
Total CHOL/HDL Ratio: 4
Triglycerides: 91 mg/dL (ref 0.0–149.0)
VLDL: 18.2 mg/dL (ref 0.0–40.0)

## 2024-02-25 LAB — HEMOGLOBIN A1C: Hgb A1c MFr Bld: 6.8 % — ABNORMAL HIGH (ref 4.6–6.5)

## 2024-02-25 LAB — TSH: TSH: 1.03 u[IU]/mL (ref 0.35–5.50)

## 2024-02-25 LAB — LDL CHOLESTEROL, DIRECT: Direct LDL: 143 mg/dL

## 2024-02-25 NOTE — Assessment & Plan Note (Signed)
 Repeat trial of low dose Crestor  in April caused myalgias/

## 2024-02-25 NOTE — Patient Instructions (Addendum)
 You do not need medications to control your diabetes at this time..  YOU HAVE PROVEN THAT  PORTION CONTROL AND EXERCISE WILL WORK !   If your cholesterol is too high without the cholesterol medication,  we will discuss a medication alternative called zetia

## 2024-02-25 NOTE — Assessment & Plan Note (Signed)
 Diagnosed in 2019 was phlebotomized once at time fo diagnosis.  Continue annual surveillance with dr Melanee.  Seen in  June 2025: Overall patient does not have any evidence of iron overload and his ferritin levels are normal at 223. CBC and CMP is within normal limits. He does not have any baseline evidence ofCirrhosis on imaging which was last done in June 2021. 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 He does not require any HCC surveillance at this time. I will continue to see him on a yearly basis

## 2024-02-25 NOTE — Progress Notes (Signed)
 Subjective:  Patient ID: Ryan Calderon, male    DOB: Sep 16, 1970  Age: 53 y.o. MRN: [id number]  CC: The primary encounter diagnosis was Type 2 diabetes mellitus without complication, without long-term current use of insulin (HCC). Diagnoses of Hyperlipidemia associated with type 2 diabetes mellitus (HCC), Prostate cancer screening, Other fatigue, Myalgia due to statin, Primary hereditary hemochromatosis, and Need for influenza vaccination were also pertinent to this visit.   HPI Ryan Calderon presents for  Chief Complaint  Patient presents with   Medical Management of Chronic Issues    6 month follow up    1) type 2 DM:  He feels generally well, is exercising several times per week and used a CBG monitor for ten days to evaluate the effect of his lifestyle dietary choices.  On his  blood sugars.  I have downloaded and reviewed the data from patient's continuous blood glucose monitor  for  the period  of  July 4 to July 17   Patient's  sugars were  IN RANGE  92   % OF THE TIME,   above range 7% below RANGE   1% of the time.  Average blood glucose  was 123 with a variability of   25%%    .  Patient's medications currently used for glycemic control are  none;  he has reduced portion size and lost 19 lbs. He notes that eating bread would cause his BS to spike, however review of data notes that the elevatiosn resolve within 2 hours to target range.  Hypoglycemic events occurred without symptoms .    2) HLD:  he has had 2 trials of rosuvastatin  ,both caused  muscle soreness and stiffness. Wore the CBG monitor    Outpatient Medications Prior to Visit  Medication Sig Dispense Refill   rosuvastatin  (CRESTOR ) 5 MG tablet Take 1 tablet (5 mg total) by mouth daily. (Patient not taking: Reported on 02/25/2024) 90 tablet 1   No facility-administered medications prior to visit.    Review of Systems;  Patient denies headache, fevers, malaise, unintentional weight loss, skin rash, eye pain, sinus congestion  and sinus pain, sore throat, dysphagia,  hemoptysis , cough, dyspnea, wheezing, chest pain, palpitations, orthopnea, edema, abdominal pain, nausea, melena, diarrhea, constipation, flank pain, dysuria, hematuria, urinary  Frequency, nocturia, numbness, tingling, seizures,  Focal weakness, Loss of consciousness,  Tremor, insomnia, depression, anxiety, and suicidal ideation.      Objective:  BP (!) 134/94   Pulse 70   Ht 6' (1.829 m)   Wt 241 lb 3.2 oz (109.4 kg)   SpO2 98%   BMI 32.71 kg/m   BP Readings from Last 3 Encounters:  02/25/24 (!) 134/94  12/01/23 (!) 116/92  11/12/23 120/88    Wt Readings from Last 3 Encounters:  02/25/24 241 lb 3.2 oz (109.4 kg)  12/01/23 240 lb 8 oz (109.1 kg)  11/12/23 248 lb 9.6 oz (112.8 kg)    Physical Exam Vitals reviewed.  Constitutional:      General: He is not in acute distress.    Appearance: Normal appearance. He is normal weight. He is not ill-appearing, toxic-appearing or diaphoretic.  HENT:     Head: Normocephalic.  Eyes:     General: No scleral icterus.       Right eye: No discharge.        Left eye: No discharge.     Conjunctiva/sclera: Conjunctivae normal.  Cardiovascular:     Rate and Rhythm: Normal rate and regular rhythm.  Heart sounds: Normal heart sounds.  Pulmonary:     Effort: Pulmonary effort is normal. No respiratory distress.     Breath sounds: Normal breath sounds.  Musculoskeletal:        General: Normal range of motion.     Cervical back: Normal range of motion.  Skin:    General: Skin is warm and dry.  Neurological:     General: No focal deficit present.     Mental Status: He is alert and oriented to person, place, and time. Mental status is at baseline.  Psychiatric:        Mood and Affect: Mood normal.        Behavior: Behavior normal.        Thought Content: Thought content normal.        Judgment: Judgment normal.     Lab Results  Component Value Date   HGBA1C 6.8 (H) 02/25/2024   HGBA1C 7.2  (H) 08/26/2023   HGBA1C 6.7 (H) 01/31/2023    Lab Results  Component Value Date   CREATININE 0.94 02/25/2024   CREATININE 1.04 10/10/2023   CREATININE 0.91 08/26/2023    Lab Results  Component Value Date   WBC 8.1 10/10/2023   HGB 15.7 10/10/2023   HCT 45.8 10/10/2023   PLT 229 10/10/2023   GLUCOSE 100 (H) 02/25/2024   CHOL 197 02/25/2024   TRIG 91.0 02/25/2024   HDL 43.80 02/25/2024   LDLDIRECT 143.0 02/25/2024   LDLCALC 135 (H) 02/25/2024   ALT 25 02/25/2024   AST 20 02/25/2024   NA 137 02/25/2024   K 4.2 02/25/2024   CL 101 02/25/2024   CREATININE 0.94 02/25/2024   BUN 14 02/25/2024   CO2 30 02/25/2024   TSH 1.03 02/25/2024   PSA 0.50 02/25/2024   HGBA1C 6.8 (H) 02/25/2024   MICROALBUR 1.2 11/12/2023    No results found.  Assessment & Plan:  .Type 2 diabetes mellitus without complication, without long-term current use of insulin (HCC) Assessment & Plan: Controlled with diet alone  and portion reduction. .  Statin not tolerated due to myalgias .  he has no proteinuria  and is losing weight   Foot exam normal.  Reviewed the standards of care  including annual eye exams to screen for retinopathy,  vaccination against pneumonia,, use of statin, and  6 month follow up   [facility] monitor placed on arm for 2 weeks period to facilitate patient's understanding of the role of exercise and diet in managing diabetes.   Lab Results  Component Value Date   HGBA1C 6.8 (H) 02/25/2024   Lab Results  Component Value Date   MICROALBUR 1.2 11/12/2023       Orders: -     Comprehensive metabolic panel with GFR -     Hemoglobin A1c  Hyperlipidemia associated with type 2 diabetes mellitus (HCC) Assessment & Plan: Untreated due to recurrence of myalgias with repeat trial of rosuvastatin  .  Will recommend trial of zetia   Lab Results  Component Value Date   CHOL 197 02/25/2024   HDL 43.80 02/25/2024   LDLCALC 135 (H) 02/25/2024   LDLDIRECT 143.0 02/25/2024   TRIG  91.0 02/25/2024   CHOLHDL 4 02/25/2024     Orders: -     Lipid panel -     LDL cholesterol, direct  Prostate cancer screening -     PSA  Other fatigue -     TSH  Myalgia due to statin Assessment & Plan: Repeat trial of  low dose Crestor  in April caused myalgias/    Primary hereditary hemochromatosis Assessment & Plan: Diagnosed in 2019 was phlebotomized once at time fo diagnosis.  Continue annual surveillance with dr Melanee.  Seen in  June 2025: Overall patient does not have any evidence of iron overload and his ferritin levels are normal at 223. CBC and CMP is within normal limits. He does not have any baseline evidence ofCirrhosis on imaging which was last done in June 2021. 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 He does not require any HCC surveillance at this time. I will continue to see him on a yearly basis    Need for influenza vaccination -     Flu vaccine trivalent PF, 6mos and older(Flulaval,Afluria,Fluarix,Fluzone)     I n addition to time spent revieweng data from his CBG monitor,  I spent 34 minutes on the day of this face to face encounter reviewing patient's  most recent hematology visit,  counseling on diabetes  management,  reviewing the assessment and plan with patient, and post visit ordering and reviewing of  diagnostics and therapeutics with patient  .   Follow-up: Return in about 6 months (around 08/25/2024) for follow up diabetes.   Verneita LITTIE Kettering, MD

## 2024-02-25 NOTE — Assessment & Plan Note (Signed)
 Controlled with diet alone  and portion reduction. .  Statin not tolerated due to myalgias .  he has no proteinuria  and is losing weight   Foot exam normal.  Reviewed the standards of care  including annual eye exams to screen for retinopathy,  vaccination against pneumonia,, use of statin, and  6 month follow up   [facility] monitor placed on arm for 2 weeks period to facilitate patient's understanding of the role of exercise and diet in managing diabetes.   Lab Results  Component Value Date   HGBA1C 6.8 (H) 02/25/2024   Lab Results  Component Value Date   MICROALBUR 1.2 11/12/2023

## 2024-02-25 NOTE — Assessment & Plan Note (Addendum)
 Untreated due to recurrence of myalgias with repeat trial of rosuvastatin  .  Will recommend trial of zetia   Lab Results  Component Value Date   CHOL 197 02/25/2024   HDL 43.80 02/25/2024   LDLCALC 135 (H) 02/25/2024   LDLDIRECT 143.0 02/25/2024   TRIG 91.0 02/25/2024   CHOLHDL 4 02/25/2024

## 2024-02-26 ENCOUNTER — Ambulatory Visit: Payer: Self-pay | Admitting: Internal Medicine

## 2024-02-26 MED ORDER — EZETIMIBE 10 MG PO TABS: 10.0000 mg | ORAL_TABLET | Freq: Every day | ORAL | 1 refills | Status: AC

## 2024-02-26 NOTE — Addendum Note (Signed)
 Addended by: MARYLYNN VERNEITA CROME on: 02/26/2024 11:09 AM   Modules accepted: Level of Service

## 2024-02-26 NOTE — Addendum Note (Signed)
 Addended by: MARYLYNN VERNEITA CROME on: 02/26/2024 10:27 PM   Modules accepted: Orders

## 2024-08-26 ENCOUNTER — Ambulatory Visit: Admitting: Internal Medicine

## 2024-10-08 ENCOUNTER — Other Ambulatory Visit

## 2024-10-08 ENCOUNTER — Ambulatory Visit: Admitting: Oncology
